# Patient Record
Sex: Female | Born: 1960
Health system: Southern US, Community
[De-identification: ages and names within clinical notes are randomized; demographics above are authoritative.]

---

## 2000-09-30 ENCOUNTER — Emergency Department (HOSPITAL_COMMUNITY): Admission: EM | Admit: 2000-09-30 | Discharge: 2000-10-01 | Payer: Self-pay | Admitting: *Deleted

## 2000-09-30 ENCOUNTER — Emergency Department (HOSPITAL_COMMUNITY): Admission: RE | Admit: 2000-09-30 | Discharge: 2000-09-30 | Payer: Self-pay | Admitting: *Deleted

## 2000-09-30 ENCOUNTER — Encounter: Payer: Self-pay | Admitting: *Deleted

## 2002-11-08 ENCOUNTER — Encounter: Payer: Self-pay | Admitting: Obstetrics and Gynecology

## 2002-11-08 ENCOUNTER — Encounter: Admission: RE | Admit: 2002-11-08 | Discharge: 2002-11-08 | Payer: Self-pay | Admitting: Obstetrics and Gynecology

## 2004-02-06 ENCOUNTER — Other Ambulatory Visit: Admission: RE | Admit: 2004-02-06 | Discharge: 2004-02-06 | Payer: Self-pay | Admitting: Family Medicine

## 2004-03-01 ENCOUNTER — Encounter: Admission: RE | Admit: 2004-03-01 | Discharge: 2004-03-01 | Payer: Self-pay | Admitting: Family Medicine

## 2005-03-25 ENCOUNTER — Emergency Department (HOSPITAL_COMMUNITY): Admission: EM | Admit: 2005-03-25 | Discharge: 2005-03-25 | Payer: Self-pay | Admitting: Emergency Medicine

## 2005-04-05 ENCOUNTER — Encounter: Admission: RE | Admit: 2005-04-05 | Discharge: 2005-04-05 | Payer: Self-pay | Admitting: Family Medicine

## 2005-04-19 ENCOUNTER — Other Ambulatory Visit: Admission: RE | Admit: 2005-04-19 | Discharge: 2005-04-19 | Payer: Self-pay | Admitting: Gynecology

## 2006-04-03 ENCOUNTER — Other Ambulatory Visit: Admission: RE | Admit: 2006-04-03 | Discharge: 2006-04-03 | Payer: Self-pay | Admitting: Gynecology

## 2006-04-26 ENCOUNTER — Encounter: Admission: RE | Admit: 2006-04-26 | Discharge: 2006-04-26 | Payer: Self-pay | Admitting: Gynecology

## 2007-04-05 ENCOUNTER — Other Ambulatory Visit: Admission: RE | Admit: 2007-04-05 | Discharge: 2007-04-05 | Payer: Self-pay | Admitting: Gynecology

## 2007-04-30 ENCOUNTER — Encounter: Admission: RE | Admit: 2007-04-30 | Discharge: 2007-04-30 | Payer: Self-pay | Admitting: Gynecology

## 2008-04-29 ENCOUNTER — Encounter: Admission: RE | Admit: 2008-04-29 | Discharge: 2008-04-29 | Payer: Self-pay | Admitting: Gynecology

## 2009-05-01 ENCOUNTER — Encounter: Admission: RE | Admit: 2009-05-01 | Discharge: 2009-05-01 | Payer: Self-pay | Admitting: Gynecology

## 2010-05-05 ENCOUNTER — Encounter
Admission: RE | Admit: 2010-05-05 | Discharge: 2010-05-05 | Payer: Self-pay | Source: Home / Self Care | Attending: Gynecology | Admitting: Gynecology

## 2010-05-09 ENCOUNTER — Encounter: Payer: Self-pay | Admitting: Gynecology

## 2010-09-03 NOTE — Consult Note (Signed)
NAMEPASQUALINA, Kristen Patel            ACCOUNT NO.:  [id number]   MEDICAL RECORD NO.:  [id number]          PATIENT TYPE:  EMS   LOCATION:  ED                           FACILITY:  [facility]   PHYSICIAN:  Sandria Bales. Ezzard Standing, M.D.  DATE OF BIRTH:  June 01, 1960   DATE OF CONSULTATION:  03/25/2005  DATE OF DISCHARGE:                                   CONSULTATION   This is a 50 year old Filipino female who started having abdominal pain on  the evening of March 23, 2005. This pain got worse yesterday, and then  today she went to [facility] and then she was  referred to [facility] emergency room for further evaluation.  She has had no nausea or vomiting though her appetite has not been good. She  has found some improvement with when she has a bowel movement with this  pain. She says she had a fever this morning. She has denied any history of  peptic ulcer disease, liver disease, pancreas disease, colon disease. Her  only prior abdominal surgery was three C-sections.   PAST MEDICAL HISTORY:  She has no allergies.   She is on no medications.   REVIEW OF SYSTEMS:  NEUROLOGIC:  No seizure, loss of consciousness.  PULMONARY:  Does not smoke cigarettes, no history of pneumonia.  CARDIAC:  No heart disease, chest pain, or hypertension.  GASTROINTESTINAL:  See history of present illness.  UROLOGIC:  No history of kidney stones or kidney infections.  GYN:  Her last menstrual period was last week. She has had three C-sections.  Her children are 71, 54, and 58 years of age.   She did say that in November she had the flu which lasted about 4 days, and  this was again in November, but she had no real GI symptoms with the flu.  She works at [employer]. She is accompanied by her  fiance, Endya Austin, who is present during most of the discussion.   PHYSICAL EXAMINATION:  VITAL SIGNS:  Her temperature is 98.9, blood pressure  103/63, her pulse is 78, respirations  18, saturations are 99%.  GENERAL:  She is a well-nourished, pleasant Filipino female.  HEENT:  Unremarkable.  NECK:  Supple without mass, without thyromegaly.  LUNGS:  Clear to auscultation.  HEART:  Has a regular rate and rhythm. She is not tachycardic. She has no  murmur.  ABDOMEN:  Soft. She has present but decreased bowel sounds. She may have  some very minimal discomfort in her lower abdomen but there is certainly no  acute surgical finding. She has no rebound, no guarding.  EXTREMITIES:  She has good strength in all four extremities.  NEUROLOGIC:  Grossly intact.   Her labs show a white blood count of 14,000; a hemoglobin of 13. Her sodium  is 136, her creatinine is 0.5. Her urinalysis was negative for any blood.  Her amylase was 73.   DIAGNOSIS:  1.  Vague lower abdominal pain which is mild. It is very unclear what this      is, and I am not real impressed with the physical  exam from a surgery      standpoint.  She actually has slept in the [facility] most of the night and      feels much better than when she came in.  Her abdominal pain is minimal.   I think she would best be served to rule out any significant disease would  be to do a CT scan of abdomen and pelvis. Discussed this with her and her  fiance and they agreed to this.   Obviously, if it shows something surgical, plan admission; if not, then she  will be discharged home. I explained this to her also.      Sandria Bales. Ezzard Standing, M.D.  Electronically Signed     DHN/MEDQ  D:  03/25/2005  T:  03/25/2005  Job:  [id number]   cc:   Charlesetta Shanks  Fax: [phone]   Magnus Sinning. Rice, M.D.  Fax: [phone]

## 2011-03-30 ENCOUNTER — Other Ambulatory Visit: Payer: Self-pay | Admitting: Gynecology

## 2011-03-30 DIAGNOSIS — Z1231 Encounter for screening mammogram for malignant neoplasm of breast: Secondary | ICD-10-CM

## 2011-05-10 ENCOUNTER — Ambulatory Visit
Admission: RE | Admit: 2011-05-10 | Discharge: 2011-05-10 | Disposition: A | Discharge status: Home / Self Care | Payer: BC Managed Care – PPO | Source: Ambulatory Visit | Attending: Gynecology | Admitting: Gynecology

## 2011-05-10 DIAGNOSIS — Z1231 Encounter for screening mammogram for malignant neoplasm of breast: Secondary | ICD-10-CM

## 2012-03-28 ENCOUNTER — Other Ambulatory Visit: Payer: Self-pay | Admitting: Gynecology

## 2012-03-28 DIAGNOSIS — Z1231 Encounter for screening mammogram for malignant neoplasm of breast: Secondary | ICD-10-CM

## 2012-05-11 ENCOUNTER — Ambulatory Visit
Admission: RE | Admit: 2012-05-11 | Discharge: 2012-05-11 | Disposition: A | Discharge status: Home / Self Care | Payer: BC Managed Care – PPO | Source: Ambulatory Visit | Attending: Gynecology | Admitting: Gynecology

## 2012-05-11 DIAGNOSIS — Z1231 Encounter for screening mammogram for malignant neoplasm of breast: Secondary | ICD-10-CM

## 2012-11-09 ENCOUNTER — Encounter: Payer: Self-pay | Admitting: Family Medicine

## 2012-11-09 ENCOUNTER — Other Ambulatory Visit (HOSPITAL_COMMUNITY): Payer: BC Managed Care – PPO

## 2012-11-09 ENCOUNTER — Ambulatory Visit (INDEPENDENT_AMBULATORY_CARE_PROVIDER_SITE_OTHER): Payer: BC Managed Care – PPO | Admitting: Family Medicine

## 2012-11-09 ENCOUNTER — Inpatient Hospital Stay (HOSPITAL_COMMUNITY): Admission: RE | Admit: 2012-11-09 | Payer: BC Managed Care – PPO | Source: Ambulatory Visit

## 2012-11-09 ENCOUNTER — Ambulatory Visit (HOSPITAL_COMMUNITY)
Admission: RE | Admit: 2012-11-09 | Discharge: 2012-11-09 | Disposition: A | Discharge status: Home / Self Care | Payer: BC Managed Care – PPO | Source: Ambulatory Visit | Attending: Family Medicine | Admitting: Family Medicine

## 2012-11-09 ENCOUNTER — Telehealth: Payer: Self-pay | Admitting: Family Medicine

## 2012-11-09 VITALS — BP 116/73 | HR 62 | Temp 97.3°F | Ht 61.0 in | Wt 131.8 lb

## 2012-11-09 DIAGNOSIS — R1012 Left upper quadrant pain: Secondary | ICD-10-CM

## 2012-11-09 DIAGNOSIS — K5730 Diverticulosis of large intestine without perforation or abscess without bleeding: Secondary | ICD-10-CM | POA: Insufficient documentation

## 2012-11-09 MED ORDER — IOHEXOL 300 MG/ML  SOLN
100.0000 mL | Freq: Once | INTRAMUSCULAR | Status: AC | PRN
  Administered 2012-11-09: 100 mL via INTRAVENOUS

## 2012-11-09 MED ORDER — IOHEXOL 300 MG/ML  SOLN
50.0000 mL | Freq: Once | INTRAMUSCULAR | Status: AC | PRN
  Administered 2012-11-09: 50 mL via ORAL

## 2012-11-09 MED ORDER — PANTOPRAZOLE SODIUM 40 MG PO TBEC: 40.0000 mg | DELAYED_RELEASE_TABLET | Freq: Every day | ORAL | Status: DC

## 2012-11-09 NOTE — Progress Notes (Signed)
New Patient Visit:  HPI:  ABDOMINAL PAIN Location: LUQ Onset: 1 week  Description: has had persistent LUQ abd pain. No radiation to back. Moderate in nature. Has seemed to worsen over last 24 hours.  Modifying factors: none    Symptoms Nausea/Vomiting: no Diarrhea: no Constipation: possible  Melena/BRBPR: no Hematemesis: no Anorexia: no Fever/Chills: no Jaundice: no Dysuria: no Back pain: no Rash: no Weight loss: no Vaginal bleeding: no STD exposure: no LMP: no Alcohol use: no NSAID use: denies  PMH Past Surgeries: no Had normal colonoscopy about 1-2 years ago per pt.       There are no active problems to display for this patient.  Past Medical History: History reviewed. No pertinent past medical history.  Past Surgical History: Past Surgical History  Procedure Laterality Date  . Cesarean section      Social History: History   Social History  . Marital Status: Married    Spouse Name: N/A    Number of Children: N/A  . Years of Education: N/A   Social History Main Topics  . Smoking status: Never Smoker   . Smokeless tobacco: None  . Alcohol Use: No  . Drug Use: No  . Sexually Active: None   Other Topics Concern  . None   Social History Narrative  . None    Family History: History reviewed. No pertinent family history.  Allergies: No Known Allergies  Current Outpatient Prescriptions  Medication Sig Dispense Refill  . calcium citrate (CALCITRATE - DOSED IN MG ELEMENTAL CALCIUM) 950 MG tablet Take 1 tablet by mouth daily.      . Cholecalciferol (VITAMIN D3) 3000 UNITS TABS Take by mouth.      . fish oil-omega-3 fatty acids 1000 MG capsule Take 2 g by mouth daily.      . pantoprazole (PROTONIX) 40 MG tablet Take 1 tablet (40 mg total) by mouth daily.  30 tablet  3   No current facility-administered medications for this visit.   Review Of Systems: 12 point ROS negative except as noted above in HPI  Physical Exam: Filed Vitals:   11/09/12 1123  BP: 116/73  Pulse: 62  Temp: 97.3 F (36.3 C)    Physical Exam  Constitutional: She appears well-developed and well-nourished.  HENT:  Head: Normocephalic and atraumatic.  Eyes: Conjunctivae are normal. Pupils are equal, round, and reactive to light.  Neck: Normal range of motion. Neck supple.  Cardiovascular: Normal rate and regular rhythm.   Pulmonary/Chest: Effort normal and breath sounds normal.  Abdominal: Soft. Bowel sounds are normal.  Marked RUQ TTP    Musculoskeletal: Normal range of motion.  Neurological: She is alert.  Skin: Skin is warm.      Axssessment and Plan:   Abdominal pain, left upper quadrant - Plan: CBC with Differential, Comprehensive metabolic panel, Lipase, pantoprazole (PROTONIX) 40 MG tablet  DDx for sxs remains relatively broad including gastritis, colitis, pancreatitis, constipation.  Pt did have some symptomatic improvement with GI cocktail, however abd pain persists.  Will send pt for CT Abd and Pelvis with IV and oral contrast to better assess anatomy.  Will also check baseline labs including CBC, CMET, lipase.  Start on PPI Follow up pending blood work and imaging.  Discussed GI and infectious red flags.

## 2012-11-09 NOTE — Progress Notes (Deleted)
  Subjective:    Patient ID: Kristen Patel, female    DOB: 1961/01/06, 52 y.o.   MRN: [id number]  HPI ABDOMINAL PAIN Location: LUQ Onset: 1 week  Description: has had persistent LUQ abd pain. No radiation to back. Moderate in nature. Has seemed to worsen over last 24 hours.  Modifying factors: none    Symptoms Nausea/Vomiting: no Diarrhea: no Constipation: possible  Melena/BRBPR: no Hematemesis: no Anorexia: no Fever/Chills: no Jaundice: no Dysuria: no Back pain: no Rash: no Weight loss: no Vaginal bleeding: no STD exposure: no LMP: no Alcohol use: no NSAID use: denies  PMH Past Surgeries: no Had normal colonoscopy about 1-2 years ago per pt.       Review of Systems  All other systems reviewed and are negative.       Objective:   Physical Exam  Constitutional: She appears well-developed and well-nourished.  HENT:  Head: Normocephalic and atraumatic.  Eyes: Conjunctivae are normal. Pupils are equal, round, and reactive to light.  Neck: Normal range of motion. Neck supple.  Cardiovascular: Normal rate and regular rhythm.   Pulmonary/Chest: Effort normal and breath sounds normal.  Abdominal: Soft. Bowel sounds are normal.  Marked RUQ TTP    Musculoskeletal: Normal range of motion.  Neurological: She is alert.  Skin: Skin is warm.          Assessment & Plan:  Abdominal pain, left upper quadrant - Plan: CBC with Differential, Comprehensive metabolic panel, Lipase, pantoprazole (PROTONIX) 40 MG tablet  DDx for sxs remains relatively broad including gastritis, colitis, pancreatitis, constipation.  Pt did have some symptomatic improvement with GI cocktail, however abd pain persists.  Will send pt for CT Abd and Pelvis with IV and oral contrast to better assess anatomy.  Will also check baseline labs including CBC, CMET, lipase.  Start on PPI Follow up pending blood work and imaging.  Discussed GI and infectious red flags.

## 2012-11-21 ENCOUNTER — Telehealth: Payer: Self-pay | Admitting: Family Medicine

## 2012-11-21 ENCOUNTER — Other Ambulatory Visit: Payer: Self-pay | Admitting: *Deleted

## 2012-11-21 DIAGNOSIS — K5790 Diverticulosis of intestine, part unspecified, without perforation or abscess without bleeding: Secondary | ICD-10-CM

## 2012-11-21 DIAGNOSIS — R1012 Left upper quadrant pain: Secondary | ICD-10-CM

## 2013-04-09 ENCOUNTER — Other Ambulatory Visit: Payer: Self-pay

## 2013-04-09 DIAGNOSIS — Z1231 Encounter for screening mammogram for malignant neoplasm of breast: Secondary | ICD-10-CM

## 2013-05-13 ENCOUNTER — Ambulatory Visit
Admission: RE | Admit: 2013-05-13 | Discharge: 2013-05-13 | Disposition: A | Discharge status: Home / Self Care | Payer: BC Managed Care – PPO | Source: Ambulatory Visit

## 2013-05-13 DIAGNOSIS — Z1231 Encounter for screening mammogram for malignant neoplasm of breast: Secondary | ICD-10-CM

## 2014-01-20 ENCOUNTER — Telehealth: Payer: Self-pay | Admitting: Family

## 2014-01-21 ENCOUNTER — Ambulatory Visit (INDEPENDENT_AMBULATORY_CARE_PROVIDER_SITE_OTHER): Payer: BC Managed Care – PPO | Admitting: Family

## 2014-01-21 ENCOUNTER — Encounter: Payer: Self-pay | Admitting: Family

## 2014-01-21 VITALS — BP 122/71 | HR 65 | Temp 97.4°F | Ht 61.0 in | Wt 136.5 lb

## 2014-01-21 DIAGNOSIS — R05 Cough: Secondary | ICD-10-CM

## 2014-01-21 DIAGNOSIS — J069 Acute upper respiratory infection, unspecified: Secondary | ICD-10-CM

## 2014-01-21 DIAGNOSIS — R059 Cough, unspecified: Secondary | ICD-10-CM

## 2014-01-21 MED ORDER — BENZONATATE 200 MG PO CAPS: 200.0000 mg | ORAL_CAPSULE | Freq: Three times a day (TID) | ORAL | Status: DC | PRN

## 2014-01-21 MED ORDER — METHYLPREDNISOLONE (PAK) 4 MG PO TABS: ORAL_TABLET | ORAL | Status: DC

## 2014-01-21 MED ORDER — AZITHROMYCIN 250 MG PO TABS: ORAL_TABLET | ORAL | Status: DC

## 2014-01-21 NOTE — Telephone Encounter (Signed)
Unable to reach patient on home phone. Husband has an appt at 8:30. Scheduled patient at same time with Jannifer Rodneyhristy Hawks, FNP.

## 2014-01-21 NOTE — Patient Instructions (Addendum)
Cough, Adult  A cough is a reflex that helps clear your throat and airways. It can help heal the body or may be a reaction to an irritated airway. A cough may only last 2 or 3 weeks (acute) or may last more than 8 weeks (chronic).  CAUSES Acute cough:  Viral or bacterial infections. Chronic cough:  Infections.  Allergies.  Asthma.  Post-nasal drip.  Smoking.  Heartburn or acid reflux.  Some medicines.  Chronic lung problems (COPD).  Cancer. SYMPTOMS   Cough.  Fever.  Chest pain.  Increased breathing rate.  High-pitched whistling sound when breathing (wheezing).  Colored mucus that you cough up (sputum). TREATMENT   A bacterial cough may be treated with antibiotic medicine.  A viral cough must run its course and will not respond to antibiotics.  Your caregiver may recommend other treatments if you have a chronic cough. HOME CARE INSTRUCTIONS   Only take over-the-counter or prescription medicines for pain, discomfort, or fever as directed by your caregiver. Use cough suppressants only as directed by your caregiver.  Use a cold steam vaporizer or humidifier in your bedroom or home to help loosen secretions.  Sleep in a semi-upright position if your cough is worse at night.  Rest as needed.  Stop smoking if you smoke. SEEK IMMEDIATE MEDICAL CARE IF:   You have pus in your sputum.  Your cough starts to worsen.  You cannot control your cough with suppressants and are losing sleep.  You begin coughing up blood.  You have difficulty breathing.  You develop pain which is getting worse or is uncontrolled with medicine.  You have a fever. MAKE SURE YOU:   Understand these instructions.  Will watch your condition.  Will get help right away if you are not doing well or get worse. Document Released: 10/01/2010 Document Revised: 06/27/2011 Document Reviewed: 10/01/2010 [facility] Patient Information 2015 [place]. This information is not intended  to replace advice given to you by your health care provider. Make sure you discuss any questions you have with your health care provider.  - Take meds as prescribed - Use a cool mist humidifier  -Use saline nose sprays frequently -Saline irrigations of the nose can be very helpful if done frequently.  * 4X daily for 1 week*  * Use of a nettie pot can be helpful with this. Follow directions with this* -Force fluids -For any cough or congestion  Use plain Mucinex- regular strength or max strength is fine   * Children- consult with Pharmacist for dosing -For fever or aces or pains- take tylenol or ibuprofen appropriate for age and weight.  * for fevers greater than 101 orally you may alternate ibuprofen and tylenol every  3 hours. -Throat lozenges if help  Jannifer Rodneyhristy Hawks, FNP

## 2014-01-21 NOTE — Progress Notes (Signed)
Subjective:    Patient ID: Kristen Patel, female    DOB: January 04, 1961, 53 y.o.   MRN: [id number]  Cough This is a new problem. The current episode started 1 to 4 weeks ago. The problem has been waxing and waning. The problem occurs constantly. The cough is productive of purulent sputum. Associated symptoms include headaches, hemoptysis and a sore throat. Pertinent negatives include no chills, ear congestion, ear pain, fever, nasal congestion, postnasal drip, rhinorrhea, shortness of breath or wheezing. The symptoms are aggravated by lying down. She has tried OTC cough suppressant for the symptoms. The treatment provided mild relief. There is no history of asthma or COPD.      Review of Systems  Constitutional: Negative.  Negative for fever and chills.  HENT: Positive for sore throat. Negative for ear pain, postnasal drip and rhinorrhea.   Eyes: Negative.   Respiratory: Positive for cough and hemoptysis. Negative for shortness of breath and wheezing.   Cardiovascular: Negative.  Negative for palpitations.  Gastrointestinal: Negative.   Endocrine: Negative.   Genitourinary: Negative.   Musculoskeletal: Negative.   Neurological: Positive for headaches.  Hematological: Negative.   Psychiatric/Behavioral: Negative.   All other systems reviewed and are negative.      Objective:   Physical Exam  Vitals reviewed. Constitutional: She is oriented to person, place, and time. She appears well-developed and well-nourished. No distress.  HENT:  Head: Normocephalic and atraumatic.  Right Ear: External ear normal.  Left Ear: External ear normal.  Nasal passage mild erythemas Oropharyngeal erythemas  Eyes: Pupils are equal, round, and reactive to light.  Neck: Normal range of motion. Neck supple. No thyromegaly present.  Cardiovascular: Normal rate, regular rhythm, normal heart sounds and intact distal pulses.   No murmur heard. Pulmonary/Chest: Effort normal and breath sounds normal. No  respiratory distress. She has no wheezes.  Abdominal: Soft. Bowel sounds are normal. She exhibits no distension. There is no tenderness.  Musculoskeletal: Normal range of motion. She exhibits no edema and no tenderness.  Neurological: She is alert and oriented to person, place, and time. She has normal reflexes. No cranial nerve deficit.  Skin: Skin is warm and dry.  Psychiatric: She has a normal mood and affect. Her behavior is normal. Judgment and thought content normal.    BP 122/71  Pulse 65  Temp(Src) 97.4 F (36.3 C) (Oral)  Ht 5\' 1"  (1.549 m)  Wt 136 lb 8 oz (61.916 kg)  BMI 25.80 kg/m2       Assessment & Plan:  1. URI (upper respiratory infection) -- Take meds as prescribed - Use a cool mist humidifier  -Use saline nose sprays frequently -Saline irrigations of the nose can be very helpful if done frequently.  * 4X daily for 1 week*  * Use of a nettie pot can be helpful with this. Follow directions with this* -Force fluids -For any cough or congestion  Use plain Mucinex- regular strength or max strength is fine   * Children- consult with Pharmacist for dosing -For fever or aces or pains- take tylenol or ibuprofen appropriate for age and weight.  * for fevers greater than 101 orally you may alternate ibuprofen and tylenol every  3 hours. -Throat lozenges if help - azithromycin (ZITHROMAX) 250 MG tablet; Take 500 mg once, then 250 mg for 4 days  Dispense: 6 tablet; Refill: 0 - benzonatate (TESSALON) 200 MG capsule; Take 1 capsule (200 mg total) by mouth 3 (three) times daily as needed.  Dispense: 30  capsule; Refill: 1 - methylPREDNIsolone (MEDROL DOSPACK) 4 MG tablet; follow package directions  Dispense: 21 tablet; Refill: 0  2. Cough - azithromycin (ZITHROMAX) 250 MG tablet; Take 500 mg once, then 250 mg for 4 days  Dispense: 6 tablet; Refill: 0 - benzonatate (TESSALON) 200 MG capsule; Take 1 capsule (200 mg total) by mouth 3 (three) times daily as needed.  Dispense: 30  capsule; Refill: 1 - methylPREDNIsolone (MEDROL DOSPACK) 4 MG tablet; follow package directions  Dispense: 21 tablet; Refill: 0  Jannifer Rodneyhristy Shaine Newmark, FNP

## 2014-03-04 ENCOUNTER — Other Ambulatory Visit: Payer: Self-pay

## 2014-03-04 DIAGNOSIS — Z1231 Encounter for screening mammogram for malignant neoplasm of breast: Secondary | ICD-10-CM

## 2014-05-14 ENCOUNTER — Ambulatory Visit
Admission: RE | Admit: 2014-05-14 | Discharge: 2014-05-14 | Disposition: A | Discharge status: Home / Self Care | Payer: BLUE CROSS/BLUE SHIELD | Source: Ambulatory Visit

## 2014-05-14 DIAGNOSIS — Z1231 Encounter for screening mammogram for malignant neoplasm of breast: Secondary | ICD-10-CM

## 2015-04-06 ENCOUNTER — Other Ambulatory Visit: Payer: Self-pay

## 2015-04-06 DIAGNOSIS — Z1231 Encounter for screening mammogram for malignant neoplasm of breast: Secondary | ICD-10-CM

## 2015-05-21 ENCOUNTER — Ambulatory Visit: Payer: BLUE CROSS/BLUE SHIELD

## 2015-06-04 ENCOUNTER — Ambulatory Visit
Admission: RE | Admit: 2015-06-04 | Discharge: 2015-06-04 | Disposition: A | Discharge status: Home / Self Care | Payer: BLUE CROSS/BLUE SHIELD | Source: Ambulatory Visit

## 2015-06-04 DIAGNOSIS — Z1231 Encounter for screening mammogram for malignant neoplasm of breast: Secondary | ICD-10-CM

## 2016-03-07 ENCOUNTER — Other Ambulatory Visit: Payer: Self-pay | Admitting: Family

## 2016-03-07 DIAGNOSIS — Z1231 Encounter for screening mammogram for malignant neoplasm of breast: Secondary | ICD-10-CM

## 2016-06-09 ENCOUNTER — Ambulatory Visit
Admission: RE | Admit: 2016-06-09 | Discharge: 2016-06-09 | Disposition: A | Discharge status: Home / Self Care | Payer: BLUE CROSS/BLUE SHIELD | Source: Ambulatory Visit | Attending: Family | Admitting: Family

## 2016-06-09 DIAGNOSIS — Z1231 Encounter for screening mammogram for malignant neoplasm of breast: Secondary | ICD-10-CM | POA: Diagnosis not present

## 2016-06-16 ENCOUNTER — Encounter: Payer: Self-pay | Admitting: Nurse Practitioner

## 2016-06-16 ENCOUNTER — Ambulatory Visit (INDEPENDENT_AMBULATORY_CARE_PROVIDER_SITE_OTHER): Payer: BLUE CROSS/BLUE SHIELD | Admitting: Nurse Practitioner

## 2016-06-16 ENCOUNTER — Ambulatory Visit: Payer: Self-pay | Admitting: Nurse Practitioner

## 2016-06-16 VITALS — BP 107/58 | HR 83 | Temp 97.4°F | Ht 61.0 in | Wt 136.8 lb

## 2016-06-16 DIAGNOSIS — J209 Acute bronchitis, unspecified: Secondary | ICD-10-CM | POA: Diagnosis not present

## 2016-06-16 MED ORDER — HYDROCODONE-HOMATROPINE 5-1.5 MG/5ML PO SYRP: 5.0000 mL | ORAL_SOLUTION | Freq: Four times a day (QID) | ORAL | 0 refills | Status: DC | PRN

## 2016-06-16 MED ORDER — AZITHROMYCIN 250 MG PO TABS: ORAL_TABLET | ORAL | 0 refills | Status: DC

## 2016-06-16 NOTE — Progress Notes (Signed)
Subjective:     Kristen SquibbLeticia G Patel is a 56 y.o. female here for evaluation of a cough. Onset of symptoms was 6 days ago. Symptoms have been gradually worsening since that time. The cough is harsh and productive of white and tenacious sputum and is aggravated by nothing. Associated symptoms include: change in voice, sputum production and sore throat. Patient does not have a history of asthma. Patient does not have a history of environmental allergens. Patient has not traveled recently. Patient does not have a history of smoking. Patient has not had a previous chest x-ray. Patient has not had a PPD done.  The following portions of the patient's history were reviewed and updated as appropriate: allergies, current medications, past family history, past medical history, past social history, past surgical history and problem list.  Review of Systems Pertinent items noted in HPI and remainder of comprehensive ROS otherwise negative.    Objective:     BP (!) 107/58   Pulse 83   Temp 97.4 F (36.3 C) (Oral)   Ht 5\' 1"  (1.549 m)   Wt 136 lb 12.8 oz (62.1 kg)   BMI 25.85 kg/m  General appearance: alert, cooperative and mild distress Eyes: conjunctivae/corneas clear. PERRL, EOM's intact. Fundi benign. Ears: normal TM's and external ear canals both ears Nose: clear discharge, moderate congestion, turbinates red, no sinus tenderness Throat: lips, mucosa, and tongue normal; teeth and gums normal Neck: no adenopathy, no carotid bruit, no JVD, supple, symmetrical, trachea midline and thyroid not enlarged, symmetric, no tenderness/mass/nodules Lungs: clear to auscultation bilaterally and dry dep cough Heart: regular rate and rhythm, S1, S2 normal, no murmur, click, rub or gallop    Assessment:    Acute Bronchitis    Plan:      1. Take meds as prescribed 2. Use a cool mist humidifier especially during the winter months and when heat has been humid. 3. Use saline nose sprays frequently 4. Saline  irrigations of the nose can be very helpful if done frequently.  * 4X daily for 1 week*  * Use of a nettie pot can be helpful with this. Follow directions with this* 5. Drink plenty of fluids 6. Keep thermostat turn down low 7.For any cough or congestion  Use plain Mucinex- regular strength or max strength is fine   * Children- consult with Pharmacist for dosing 8. For fever or aces or pains- take tylenol or ibuprofen appropriate for age and weight.  * for fevers greater than 101 orally you may alternate ibuprofen and tylenol every  3 hours.   Meds ordered this encounter  Medications  . azithromycin (ZITHROMAX Z-PAK) 250 MG tablet    Sig: As directed    Dispense:  6 tablet    Refill:  0    Order Specific Question:   Supervising Provider    Answer:   VINCENT, Kristen Patel [[id number]]  . HYDROcodone-homatropine (HYCODAN) 5-1.5 MG/5ML syrup    Sig: Take 5 mLs by mouth every 6 (six) hours as needed for cough.    Dispense:  120 mL    Refill:  0    Order Specific Question:   Supervising Provider    Answer:   Kristen SheriffVINCENT, Kristen Patel [[id number]]   Kristen Daphine DeutscherMartin, FNP

## 2016-06-16 NOTE — Patient Instructions (Signed)

## 2016-06-22 ENCOUNTER — Telehealth: Payer: Self-pay | Admitting: Nurse Practitioner

## 2016-06-22 NOTE — Telephone Encounter (Signed)
Patient notified that note up front and ready for pick up

## 2016-06-29 ENCOUNTER — Telehealth: Payer: Self-pay | Admitting: Family

## 2017-04-26 ENCOUNTER — Other Ambulatory Visit: Payer: Self-pay | Admitting: Family

## 2017-04-26 DIAGNOSIS — Z1231 Encounter for screening mammogram for malignant neoplasm of breast: Secondary | ICD-10-CM

## 2017-06-15 ENCOUNTER — Ambulatory Visit: Payer: BLUE CROSS/BLUE SHIELD

## 2017-08-11 ENCOUNTER — Encounter: Payer: Self-pay | Admitting: Family Medicine

## 2017-08-11 ENCOUNTER — Ambulatory Visit (INDEPENDENT_AMBULATORY_CARE_PROVIDER_SITE_OTHER): Payer: BLUE CROSS/BLUE SHIELD | Admitting: Family Medicine

## 2017-08-11 VITALS — BP 117/61 | HR 70 | Temp 97.5°F | Ht 61.0 in | Wt 139.0 lb

## 2017-08-11 DIAGNOSIS — R059 Cough, unspecified: Secondary | ICD-10-CM

## 2017-08-11 DIAGNOSIS — R05 Cough: Secondary | ICD-10-CM

## 2017-08-11 DIAGNOSIS — J019 Acute sinusitis, unspecified: Secondary | ICD-10-CM

## 2017-08-11 MED ORDER — GUAIFENESIN-CODEINE 100-10 MG/5ML PO SOLN: 5.0000 mL | ORAL | 0 refills | Status: DC | PRN

## 2017-08-11 MED ORDER — AZITHROMYCIN 250 MG PO TABS: ORAL_TABLET | ORAL | 0 refills | Status: DC

## 2017-08-11 NOTE — Progress Notes (Signed)
Subjective: CC: allergies PCP: Junie SpencerHawks, Christy A, FNP RUE:[id number]:Kristen Patel is a 57 y.o. female presenting to clinic today for:  1. Allergies Patient reports that she has had a dry cough, chest congestion, sneezing, rhinorrhea and sensation of difficulty taking a deep breath in for about 9 days now.  She notes that over the last 2 days she has been using over-the-counter NyQuil and DayQuil as well as Allegra.  She has found this somewhat helpful and notices improvement in sleep, particularly with the NyQuil.  She notes that she typically gets sick around this time every year and requires an antibiotic and cough medication in order to get rid of symptoms.  Denies any hemoptysis, change in exercise tolerance, lower extremity edema, fevers, nausea, vomiting or diarrhea.   ROS: Per HPI  No Known Allergies No past medical history on file.  Current Outpatient Medications:  .  azithromycin (ZITHROMAX Z-PAK) 250 MG tablet, As directed, Disp: 6 tablet, Rfl: 0 .  calcium citrate (CALCITRATE - DOSED IN MG ELEMENTAL CALCIUM) 950 MG tablet, Take 1 tablet by mouth daily., Disp: , Rfl:  .  Cholecalciferol (VITAMIN D3) 3000 UNITS TABS, Take by mouth., Disp: , Rfl:  .  fish oil-omega-3 fatty acids 1000 MG capsule, Take 2 g by mouth daily., Disp: , Rfl:  .  HYDROcodone-homatropine (HYCODAN) 5-1.5 MG/5ML syrup, Take 5 mLs by mouth every 6 (six) hours as needed for cough., Disp: 120 mL, Rfl: 0 Social History   Socioeconomic History  . Marital status: Married    Spouse name: Not on file  . Number of children: Not on file  . Years of education: Not on file  . Highest education level: Not on file  Occupational History  . Not on file  Social Needs  . Financial resource strain: Not on file  . Food insecurity:    Worry: Not on file    Inability: Not on file  . Transportation needs:    Medical: Not on file    Non-medical: Not on file  Tobacco Use  . Smoking status: Never Smoker  . Smokeless tobacco:  Never Used  Substance and Sexual Activity  . Alcohol use: No  . Drug use: No  . Sexual activity: Not on file  Lifestyle  . Physical activity:    Days per week: Not on file    Minutes per session: Not on file  . Stress: Not on file  Relationships  . Social connections:    Talks on phone: Not on file    Gets together: Not on file    Attends religious service: Not on file    Active member of club or organization: Not on file    Attends meetings of clubs or organizations: Not on file    Relationship status: Not on file  . Intimate partner violence:    Fear of current or ex partner: Not on file    Emotionally abused: Not on file    Physically abused: Not on file    Forced sexual activity: Not on file  Other Topics Concern  . Not on file  Social History Narrative  . Not on file   No family history on file.  Objective: Office vital signs reviewed. BP 117/61   Pulse 70   Temp (!) 97.5 F (36.4 C) (Oral)   Ht 5\' 1"  (1.549 m)   Wt 139 lb (63 kg)   SpO2 96%   BMI 26.26 kg/m   Physical Examination:  General: Awake, alert, well  nourished, nontoxic appearing, No acute distress HEENT: Normal    Neck: No masses palpated. No lymphadenopathy    Ears: Tympanic membranes intact, normal light reflex, no erythema, no bulging    Eyes: PERRLA, extraocular membranes intact, sclera white    Nose: nasal turbinates moist, clear nasal discharge    Throat: moist mucus membranes, no erythema, no tonsillar exudate.  Airway is patent Cardio: regular rate and rhythm, S1S2 heard, no murmurs appreciated Pulm: clear to auscultation bilaterally, no wheezes, rhonchi or rales; normal work of breathing on room air  Assessment/ Plan: 57 y.o. female   1. Acute non-recurrent sinusitis, unspecified location Patient is afebrile, nontoxic-appearing and has normal oxygen saturations on room air.  Her symptoms are consistent with a sinusitis, I do suspect that this is more likely to be allergy mediated than  viral mediated.  However, since her symptoms are fairly refractory to OTC medications, I have provided her a short prescription of Robitussin-AC to use as needed as directed.  I cautioned sedation.  Can hold NyQuil and DayQuil while taking this medication.  Avoid use at work.  I have also given her a pocket prescription for Z-Pak.  However, I did reinforce that she does not appear infectious today and that she should hold off on filling this medication unless symptoms substantially worsen or do not respond to current therapies.  She voiced good understanding and will perform home care instructions as directed.  Follow-up as needed.  2. Cough As Above   Meds ordered this encounter  Medications  . guaiFENesin-codeine 100-10 MG/5ML syrup    Sig: Take 5 mLs by mouth every 4 (four) hours as needed for cough.    Dispense:  120 mL    Refill:  0  . azithromycin (ZITHROMAX Z-PAK) 250 MG tablet    Sig: Take 500 mg day 1.  Then take 250 mg days 2 through 5.    Dispense:  6 tablet    Refill:  0    The Narcotic Database has been reviewed.  There were no red flags.    Raliegh Ip, DO [facility] [phone]

## 2017-08-11 NOTE — Patient Instructions (Signed)
I think that this is probably viral.  However, we are close to when we would expect a virus to be over.  For this reason, I have given you a prescription to hold onto until next week if symptoms are not getting better.  For now, use the cough syrup and the Allegra to see if your symptoms get better.  It appears that you have a viral upper respiratory infection (cold).  Cold symptoms can last up to 2 weeks.    - Get plenty of rest and drink plenty of fluids. - Try to breathe moist air. Use a cold mist humidifier. - Consume warm fluids (soup or tea) to provide relief for a stuffy nose and to loosen phlegm. - For nasal stuffiness, try saline nasal spray or a Neti Pot. Afrin nasal spray can also be used but this product should not be used longer than 3 days or it will cause rebound nasal stuffiness (worsening nasal congestion). - For sore throat pain relief: suck on throat lozenges, hard candy or popsicles; gargle with warm salt water (1/4 tsp. salt per 8 oz. of water); and eat soft, bland foods. - Eat a well-balanced diet. If you cannot, ensure you are getting enough nutrients by taking a daily multivitamin. - Avoid dairy products, as they can thicken phlegm. - Avoid alcohol, as it impairs your body's immune system.  CONTACT YOUR DOCTOR IF YOU EXPERIENCE ANY OF THE FOLLOWING: - High fever - Ear pain - Sinus-type headache - Unusually severe cold symptoms - Cough that gets worse while other cold symptoms improve - Flare up of any chronic lung problem, such as asthma - Your symptoms persist longer than 2 weeks

## 2017-09-29 ENCOUNTER — Ambulatory Visit
Admission: RE | Admit: 2017-09-29 | Discharge: 2017-09-29 | Disposition: A | Discharge status: Home / Self Care | Payer: BLUE CROSS/BLUE SHIELD | Source: Ambulatory Visit | Attending: Family | Admitting: Family

## 2017-09-29 DIAGNOSIS — Z1231 Encounter for screening mammogram for malignant neoplasm of breast: Secondary | ICD-10-CM

## 2017-10-17 DIAGNOSIS — Z01419 Encounter for gynecological examination (general) (routine) without abnormal findings: Secondary | ICD-10-CM | POA: Diagnosis not present

## 2017-10-17 DIAGNOSIS — Z6824 Body mass index (BMI) 24.0-24.9, adult: Secondary | ICD-10-CM | POA: Diagnosis not present

## 2018-08-20 ENCOUNTER — Other Ambulatory Visit: Payer: Self-pay | Admitting: Family

## 2018-08-20 DIAGNOSIS — Z1231 Encounter for screening mammogram for malignant neoplasm of breast: Secondary | ICD-10-CM

## 2018-08-31 ENCOUNTER — Telehealth: Payer: Self-pay | Admitting: Family

## 2018-10-25 ENCOUNTER — Other Ambulatory Visit: Payer: Self-pay

## 2018-10-25 ENCOUNTER — Ambulatory Visit
Admission: RE | Admit: 2018-10-25 | Discharge: 2018-10-25 | Disposition: A | Discharge status: Home / Self Care | Payer: BC Managed Care – PPO | Source: Ambulatory Visit | Attending: Family | Admitting: Family

## 2018-10-25 DIAGNOSIS — Z1231 Encounter for screening mammogram for malignant neoplasm of breast: Secondary | ICD-10-CM | POA: Diagnosis not present

## 2019-02-26 ENCOUNTER — Telehealth: Payer: Self-pay | Admitting: Family

## 2019-02-27 ENCOUNTER — Ambulatory Visit: Payer: Self-pay

## 2019-04-11 ENCOUNTER — Other Ambulatory Visit: Payer: BC Managed Care – PPO

## 2019-09-17 ENCOUNTER — Ambulatory Visit (INDEPENDENT_AMBULATORY_CARE_PROVIDER_SITE_OTHER): Payer: Self-pay | Admitting: Family

## 2019-09-17 ENCOUNTER — Encounter: Payer: Self-pay | Admitting: Family

## 2019-09-17 DIAGNOSIS — J209 Acute bronchitis, unspecified: Secondary | ICD-10-CM

## 2019-09-17 MED ORDER — PREDNISONE 10 MG (21) PO TBPK: ORAL_TABLET | ORAL | 0 refills | Status: DC

## 2019-09-17 NOTE — Progress Notes (Signed)
   Virtual Visit via telephone Note Due to COVID-19 pandemic this visit was conducted virtually. This visit type was conducted due to national recommendations for restrictions regarding the COVID-19 Pandemic (e.g. social distancing, sheltering in place) in an effort to limit this patient's exposure and mitigate transmission in our community. All issues noted in this document were discussed and addressed.  A physical exam was not performed with this format.  I connected with Kristen Patel on 09/17/19 at 2:05 pm  by telephone and verified that I am speaking with the correct person using two identifiers. Kristen Patel is currently located at home and no one is currently with her during visit. The provider, Jannifer Rodney, FNP is located in their office at time of visit.  I discussed the limitations, risks, security and privacy concerns of performing an evaluation and management service by telephone and the availability of in person appointments. I also discussed with the patient that there may be a patient responsible charge related to this service. The patient expressed understanding and agreed to proceed.   History and Present Illness:  Cough This is a new problem. The current episode started in the past 7 days. The problem has been waxing and waning. The problem occurs every few minutes. The cough is productive of sputum. Associated symptoms include postnasal drip, shortness of breath ("some times") and wheezing. Pertinent negatives include no chills, ear congestion, ear pain, fever, headaches or myalgias. The symptoms are aggravated by pollens. She has tried OTC cough suppressant for the symptoms. The treatment provided mild relief.      Review of Systems  Constitutional: Negative for chills and fever.  HENT: Positive for postnasal drip. Negative for ear pain.   Respiratory: Positive for cough, shortness of breath ("some times") and wheezing.   Musculoskeletal: Negative for myalgias.    Neurological: Negative for headaches.     Observations/Objective: No SOB or distress noted  Assessment and Plan: 1. Acute bronchitis, unspecified organism - Take meds as prescribed - Use a cool mist humidifier  -Use saline nose sprays frequently -Force fluids -For any cough or congestion  Use plain Mucinex- regular strength or max strength is fine -For fever or aces or pains- take tylenol or ibuprofen. -Throat lozenges if help -Call if symptoms worsen or do not improve  - predniSONE (STERAPRED UNI-PAK 21 TAB) 10 MG (21) TBPK tablet; Use as directed  Dispense: 21 tablet; Refill: 0     I discussed the assessment and treatment plan with the patient. The patient was provided an opportunity to ask questions and all were answered. The patient agreed with the plan and demonstrated an understanding of the instructions.   The patient was advised to call back or seek an in-person evaluation if the symptoms worsen or if the condition fails to improve as anticipated.  The above assessment and management plan was discussed with the patient. The patient verbalized understanding of and has agreed to the management plan. Patient is aware to call the clinic if symptoms persist or worsen. Patient is aware when to return to the clinic for a follow-up visit. Patient educated on when it is appropriate to go to the emergency department.   Time call ended:  2:15 pm  I provided 10 minutes of non-face-to-face time during this encounter.    Jannifer Rodney, FNP

## 2019-09-23 ENCOUNTER — Other Ambulatory Visit: Payer: Self-pay | Admitting: Family

## 2019-09-23 DIAGNOSIS — J209 Acute bronchitis, unspecified: Secondary | ICD-10-CM

## 2020-09-08 ENCOUNTER — Other Ambulatory Visit: Payer: Self-pay | Admitting: Family

## 2020-09-08 DIAGNOSIS — Z1231 Encounter for screening mammogram for malignant neoplasm of breast: Secondary | ICD-10-CM

## 2020-10-27 ENCOUNTER — Other Ambulatory Visit: Payer: Self-pay

## 2020-10-27 ENCOUNTER — Ambulatory Visit
Admission: RE | Admit: 2020-10-27 | Discharge: 2020-10-27 | Disposition: A | Discharge status: Home / Self Care | Payer: BC Managed Care – PPO | Source: Ambulatory Visit | Attending: Family | Admitting: Family

## 2020-10-27 DIAGNOSIS — Z1231 Encounter for screening mammogram for malignant neoplasm of breast: Secondary | ICD-10-CM

## 2020-11-05 ENCOUNTER — Ambulatory Visit: Payer: BC Managed Care – PPO

## 2021-05-20 ENCOUNTER — Ambulatory Visit: Payer: BC Managed Care – PPO | Admitting: Family

## 2021-05-25 ENCOUNTER — Ambulatory Visit: Payer: BC Managed Care – PPO | Admitting: Family

## 2021-05-25 ENCOUNTER — Encounter: Payer: Self-pay | Admitting: Family

## 2021-05-25 VITALS — BP 137/77 | HR 97 | Temp 97.1°F | Ht 61.0 in | Wt 143.8 lb

## 2021-05-25 DIAGNOSIS — E663 Overweight: Secondary | ICD-10-CM

## 2021-05-25 DIAGNOSIS — K219 Gastro-esophageal reflux disease without esophagitis: Secondary | ICD-10-CM | POA: Diagnosis not present

## 2021-05-25 DIAGNOSIS — Z1159 Encounter for screening for other viral diseases: Secondary | ICD-10-CM

## 2021-05-25 DIAGNOSIS — Z1211 Encounter for screening for malignant neoplasm of colon: Secondary | ICD-10-CM | POA: Diagnosis not present

## 2021-05-25 DIAGNOSIS — Z114 Encounter for screening for human immunodeficiency virus [HIV]: Secondary | ICD-10-CM

## 2021-05-25 DIAGNOSIS — Z0000 Encounter for general adult medical examination without abnormal findings: Secondary | ICD-10-CM

## 2021-05-25 DIAGNOSIS — Z0001 Encounter for general adult medical examination with abnormal findings: Secondary | ICD-10-CM | POA: Diagnosis not present

## 2021-05-25 MED ORDER — OMEPRAZOLE 20 MG PO CPDR: 20.0000 mg | DELAYED_RELEASE_CAPSULE | Freq: Every day | ORAL | 1 refills | Status: DC

## 2021-05-25 NOTE — Patient Instructions (Signed)
Gastroesophageal Reflux Disease, Adult ?Gastroesophageal reflux (GER) happens when acid from the stomach flows up into the tube that connects the mouth and the stomach (esophagus). Normally, food travels down the esophagus and stays in the stomach to be digested. However, when a person has GER, food and stomach acid sometimes move back up into the esophagus. If this becomes a more serious problem, the person may be diagnosed with a disease called gastroesophageal reflux disease (GERD). GERD occurs when the reflux: ?Happens often. ?Causes frequent or severe symptoms. ?Causes problems such as damage to the esophagus. ?When stomach acid comes in contact with the esophagus, the acid may cause inflammation in the esophagus. Over time, GERD may create small holes (ulcers) in the lining of the esophagus. ?What are the causes? ?This condition is caused by a problem with the muscle between the esophagus and the stomach (lower esophageal sphincter, or LES). Normally, the LES muscle closes after food passes through the esophagus to the stomach. When the LES is weakened or abnormal, it does not close properly, and that allows food and stomach acid to go back up into the esophagus. ?The LES can be weakened by certain dietary substances, medicines, and medical conditions, including: ?Tobacco use. ?Pregnancy. ?Having a hiatal hernia. ?Alcohol use. ?Certain foods and beverages, such as coffee, chocolate, onions, and peppermint. ?What increases the risk? ?You are more likely to develop this condition if you: ?Have an increased body weight. ?Have a connective tissue disorder. ?Take NSAIDs, such as ibuprofen. ?What are the signs or symptoms? ?Symptoms of this condition include: ?Heartburn. ?Difficult or painful swallowing and the feeling of having a lump in the throat. ?A bitter taste in the mouth. ?Bad breath and having a large amount of saliva. ?Having an upset or bloated stomach and belching. ?Chest pain. Different conditions can  cause chest pain. Make sure you see your health care provider if you experience chest pain. ?Shortness of breath or wheezing. ?Ongoing (chronic) cough or a nighttime cough. ?Wearing away of tooth enamel. ?Weight loss. ?How is this diagnosed? ?This condition may be diagnosed based on a medical history and a physical exam. To determine if you have mild or severe GERD, your health care provider may also monitor how you respond to treatment. You may also have tests, including: ?A test to examine your stomach and esophagus with a small camera (endoscopy). ?A test that measures the acidity level in your esophagus. ?A test that measures how much pressure is on your esophagus. ?A barium swallow or modified barium swallow test to show the shape, size, and functioning of your esophagus. ?How is this treated? ?Treatment for this condition may vary depending on how severe your symptoms are. Your health care provider may recommend: ?Changes to your diet. ?Medicine. ?Surgery. ?The goal of treatment is to help relieve your symptoms and to prevent complications. ?Follow these instructions at home: ?Eating and drinking ? ?Follow a diet as recommended by your health care provider. This may involve avoiding foods and drinks such as: ?Coffee and tea, with or without caffeine. ?Drinks that contain alcohol. ?Energy drinks and sports drinks. ?Carbonated drinks or sodas. ?Chocolate and cocoa. ?Peppermint and mint flavorings. ?Garlic and onions. ?Horseradish. ?Spicy and acidic foods, including peppers, chili powder, curry powder, vinegar, hot sauces, and barbecue sauce. ?Citrus fruit juices and citrus fruits, such as oranges, lemons, and limes. ?Tomato-based foods, such as red sauce, chili, salsa, and pizza with red sauce. ?Fried and fatty foods, such as donuts, french fries, potato chips, and high-fat dressings. ?  High-fat meats, such as hot dogs and fatty cuts of red and white meats, such as rib eye steak, sausage, ham, and  bacon. ?High-fat dairy items, such as whole milk, butter, and cream cheese. ?Eat small, frequent meals instead of large meals. ?Avoid drinking large amounts of liquid with your meals. ?Avoid eating meals during the 2-3 hours before bedtime. ?Avoid lying down right after you eat. ?Do not exercise right after you eat. ?Lifestyle ? ?Do not use any products that contain nicotine or tobacco. These products include cigarettes, chewing tobacco, and vaping devices, such as e-cigarettes. If you need help quitting, ask your health care provider. ?Try to reduce your stress by using methods such as yoga or meditation. If you need help reducing stress, ask your health care provider. ?If you are overweight, reduce your weight to an amount that is healthy for you. Ask your health care provider for guidance about a safe weight loss goal. ?General instructions ?Pay attention to any changes in your symptoms. ?Take over-the-counter and prescription medicines only as told by your health care provider. Do not take aspirin, ibuprofen, or other NSAIDs unless your health care provider told you to take these medicines. ?Wear loose-fitting clothing. Do not wear anything tight around your waist that causes pressure on your abdomen. ?Raise (elevate) the head of your bed about 6 inches (15 cm). You can use a wedge to do this. ?Avoid bending over if this makes your symptoms worse. ?Keep all follow-up visits. This is important. ?Contact a health care provider if: ?You have: ?New symptoms. ?Unexplained weight loss. ?Difficulty swallowing or it hurts to swallow. ?Wheezing or a persistent cough. ?A hoarse voice. ?Your symptoms do not improve with treatment. ?Get help right away if: ?You have sudden pain in your arms, neck, jaw, teeth, or back. ?You suddenly feel sweaty, dizzy, or light-headed. ?You have chest pain or shortness of breath. ?You vomit and the vomit is green, yellow, or black, or it looks like blood or coffee grounds. ?You faint. ?You  have stool that is red, bloody, or black. ?You cannot swallow, drink, or eat. ?These symptoms may represent a serious problem that is an emergency. Do not wait to see if the symptoms will go away. Get medical help right away. Call your local emergency services (911 in the [place] Do not drive yourself to the hospital. ?Summary ?Gastroesophageal reflux happens when acid from the stomach flows up into the esophagus. GERD is a disease in which the reflux happens often, causes frequent or severe symptoms, or causes problems such as damage to the esophagus. ?Treatment for this condition may vary depending on how severe your symptoms are. Your health care provider may recommend diet and lifestyle changes, medicine, or surgery. ?Contact a health care provider if you have new or worsening symptoms. ?Take over-the-counter and prescription medicines only as told by your health care provider. Do not take aspirin, ibuprofen, or other NSAIDs unless your health care provider told you to do so. ?Keep all follow-up visits as told by your health care provider. This is important. ?This information is not intended to replace advice given to you by your health care provider. Make sure you discuss any questions you have with your health care provider. ?Document Revised: 10/14/2019 Document Reviewed: 10/14/2019 ?Elsevier Patient Education ? 2022 [employer] ? ?

## 2021-05-25 NOTE — Progress Notes (Signed)
Subjective:    Patient ID: Kristen Patel, female    DOB: 23-Dec-1960, 61 y.o.   MRN: [id number]  Chief Complaint  Patient presents with   Medical Management of Chronic Issues    Acid reflux   PT presents to the office today for CPE without pap. She is complaining of GERD.  Gastroesophageal Reflux She complains of belching, early satiety and heartburn. She reports no dysphagia or no globus sensation. This is a chronic problem. The current episode started more than 1 year ago. The problem occurs occasionally. The problem has been gradually worsening. The symptoms are aggravated by certain foods. She has tried nothing for the symptoms. The treatment provided no relief.     Review of Systems  Gastrointestinal:  Positive for heartburn. Negative for dysphagia.  All other systems reviewed and are negative.     History reviewed. No pertinent family history. Social History   Socioeconomic History   Marital status: Married    Spouse name: Not on file   Number of children: Not on file   Years of education: Not on file   Highest education level: Not on file  Occupational History   Not on file  Tobacco Use   Smoking status: Never   Smokeless tobacco: Never  Substance and Sexual Activity   Alcohol use: No   Drug use: No   Sexual activity: Not on file  Other Topics Concern   Not on file  Social History Narrative   Not on file   Social Determinants of Health   Financial Resource Strain: Not on file  Food Insecurity: Not on file  Transportation Needs: Not on file  Physical Activity: Not on file  Stress: Not on file  Social Connections: Not on file    Objective:   Physical Exam Vitals reviewed.  Constitutional:      General: She is not in acute distress.    Appearance: She is well-developed.  HENT:     Head: Normocephalic and atraumatic.     Right Ear: Tympanic membrane normal.     Left Ear: Tympanic membrane normal.  Eyes:     Pupils: Pupils are equal, round, and  reactive to light.  Neck:     Thyroid: No thyromegaly.  Cardiovascular:     Rate and Rhythm: Normal rate and regular rhythm.     Heart sounds: Normal heart sounds. No murmur heard. Pulmonary:     Effort: Pulmonary effort is normal. No respiratory distress.     Breath sounds: Normal breath sounds. No wheezing.  Abdominal:     General: Bowel sounds are normal. There is no distension.     Palpations: Abdomen is soft.     Tenderness: There is no abdominal tenderness.  Musculoskeletal:        General: No tenderness. Normal range of motion.     Cervical back: Normal range of motion and neck supple.  Skin:    General: Skin is warm and dry.  Neurological:     Mental Status: She is alert and oriented to person, place, and time.     Cranial Nerves: No cranial nerve deficit.     Deep Tendon Reflexes: Reflexes are normal and symmetric.  Psychiatric:        Behavior: Behavior normal.        Thought Content: Thought content normal.        Judgment: Judgment normal.      BP 137/77    Pulse 97    Temp (!) 97.1  F (36.2 C) (Temporal)    Ht _0  (1.549 m)    Wt 143 lb 12.8 oz (65.2 kg)    BMI 27.17 kg/m      Assessment & Plan:  Kristen Patel comes in today with chief complaint of Gastroesophageal Reflux   Diagnosis and orders addressed:  1. Annual physical exam - Ambulatory referral to Gastroenterology - CMP14+EGFR - CBC with Differential/Platelet - Lipid panel - TSH - HIV Antibody (routine testing w rflx) - Hepatitis C antibody  2. Gastroesophageal reflux disease, unspecified whether esophagitis present -Start Omeprazole 20 mg  -Diet discussed- Avoid fried, spicy, citrus foods, caffeine and alcohol -Do not eat 2-3 hours before bedtime -Encouraged small frequent meals -Avoid NSAID's - Ambulatory referral to Gastroenterology - CMP14+EGFR - CBC with Differential/Platelet - omeprazole (PRILOSEC) 20 MG capsule; Take 1 capsule (20 mg total) by mouth daily.  Dispense: 90 capsule;  Refill: 1  3. Colon cancer screening - Ambulatory referral to Gastroenterology - CMP14+EGFR - CBC with Differential/Platelet  4. Need for hepatitis C screening test - CMP14+EGFR - CBC with Differential/Platelet - Hepatitis C antibody  5. Encounter for screening for HIV - CMP14+EGFR - CBC with Differential/Platelet - HIV Antibody (routine testing w rflx)  6. Overweight (BMI 25.0-29.9)   Labs pending Health Maintenance reviewed Diet and exercise encouraged  Follow up plan: 1 year   Evelina Dun, FNP

## 2021-05-26 LAB — CBC WITH DIFFERENTIAL/PLATELET
Basophils Absolute: 0.1 10*3/uL (ref 0.0–0.2)
Basos: 1 %
EOS (ABSOLUTE): 0.3 10*3/uL (ref 0.0–0.4)
Eos: 6 %
Hematocrit: 45 % (ref 34.0–46.6)
Hemoglobin: 14.9 g/dL (ref 11.1–15.9)
Immature Grans (Abs): 0 10*3/uL (ref 0.0–0.1)
Immature Granulocytes: 0 %
Lymphocytes Absolute: 2.2 10*3/uL (ref 0.7–3.1)
Lymphs: 36 %
MCH: 29.8 pg (ref 26.6–33.0)
MCHC: 33.1 g/dL (ref 31.5–35.7)
MCV: 90 fL (ref 79–97)
Monocytes Absolute: 0.7 10*3/uL (ref 0.1–0.9)
Monocytes: 11 %
Neutrophils Absolute: 2.9 10*3/uL (ref 1.4–7.0)
Neutrophils: 46 %
Platelets: 301 10*3/uL (ref 150–450)
RBC: 5 x10E6/uL (ref 3.77–5.28)
RDW: 11.9 % (ref 11.7–15.4)
WBC: 6.2 10*3/uL (ref 3.4–10.8)

## 2021-05-26 LAB — TSH: TSH: 4.61 u[IU]/mL — ABNORMAL HIGH (ref 0.450–4.500)

## 2021-05-26 LAB — CMP14+EGFR
ALT: 28 IU/L (ref 0–32)
AST: 21 IU/L (ref 0–40)
Albumin/Globulin Ratio: 1.4 (ref 1.2–2.2)
Albumin: 4.3 g/dL (ref 3.8–4.9)
Alkaline Phosphatase: 80 IU/L (ref 44–121)
BUN/Creatinine Ratio: 20 (ref 12–28)
BUN: 12 mg/dL (ref 8–27)
Bilirubin Total: 0.3 mg/dL (ref 0.0–1.2)
CO2: 22 mmol/L (ref 20–29)
Calcium: 9 mg/dL (ref 8.7–10.3)
Chloride: 102 mmol/L (ref 96–106)
Creatinine, Ser: 0.59 mg/dL (ref 0.57–1.00)
Globulin, Total: 3 g/dL (ref 1.5–4.5)
Glucose: 98 mg/dL (ref 70–99)
Potassium: 4.1 mmol/L (ref 3.5–5.2)
Sodium: 137 mmol/L (ref 134–144)
Total Protein: 7.3 g/dL (ref 6.0–8.5)
eGFR: 103 mL/min/{1.73_m2} (ref >59)

## 2021-05-26 LAB — LIPID PANEL
Chol/HDL Ratio: 3.5 ratio (ref 0.0–4.4)
Cholesterol, Total: 228 mg/dL — ABNORMAL HIGH (ref 100–199)
HDL: 66 mg/dL (ref >39)
LDL Chol Calc (NIH): 128 mg/dL — ABNORMAL HIGH (ref 0–99)
Triglycerides: 194 mg/dL — ABNORMAL HIGH (ref 0–149)
VLDL Cholesterol Cal: 34 mg/dL (ref 5–40)

## 2021-05-26 LAB — HIV ANTIBODY (ROUTINE TESTING W REFLEX): HIV Screen 4th Generation wRfx: NONREACTIVE

## 2021-05-26 LAB — HEPATITIS C ANTIBODY: Hep C Virus Ab: <0.1 s/co ratio (ref 0.0–0.9)

## 2021-06-22 ENCOUNTER — Encounter: Payer: Self-pay | Admitting: Nurse Practitioner

## 2021-07-01 ENCOUNTER — Encounter: Payer: Self-pay | Admitting: Nurse Practitioner

## 2021-07-01 ENCOUNTER — Ambulatory Visit (INDEPENDENT_AMBULATORY_CARE_PROVIDER_SITE_OTHER): Payer: BC Managed Care – PPO | Admitting: Nurse Practitioner

## 2021-07-01 VITALS — BP 142/74 | HR 76 | Ht 61.0 in | Wt 146.0 lb

## 2021-07-01 DIAGNOSIS — K219 Gastro-esophageal reflux disease without esophagitis: Secondary | ICD-10-CM

## 2021-07-01 NOTE — Patient Instructions (Addendum)
Acid Reflux ?--If you are taking anti-reflux ( GERD) medication be sure to take it 30 minutes before breakfast and if taking twice daily then also 30 minutes before dinner.   ?--Avoid late meals / bedtime snacks.   ?--Avoid trigger foods ( foods which you know tend to aggravate you reflux symptoms). Some of the more common triggers include spicy foods, fatty foods, acidic foods, chocolate and caffeine.  ?--Elevate the head of bed 6-8 inches on blocks or bricks. If not able to elevate the head of the bed consider purchasing a wedge pillow to sleep on. This can prevent gastric fluid from refluxing into your esophagus.    ?--Weight reduction / maintain a healthy BMI ( body mass index) may be help with reflux symptoms  ?---Sometimes with the above mentioned "lifestyle changes" patients are able to reduce the amount of GERD medications they take. Our goal would be to have you on the lowest effective dose of medication ? ?If you are age 46 or older, your body mass index should be between 23-30. Your Body mass index is 27.59 kg/m?Marland Kitchen If this is out of the aforementioned range listed, please consider follow up with your Primary Care Provider. ? ?If you are age 59 or younger, your body mass index should be between 19-25. Your Body mass index is 27.59 kg/m?Marland Kitchen If this is out of the aformentioned range listed, please consider follow up with your Primary Care Provider.  ? ?Continue Omeprazole for now. ? ?Follow up in 8 weeks  ? ?The [facility] providers would like to encourage you to use [facility] to communicate with providers for non-urgent requests or questions.  Due to long hold times on the telephone, sending your provider a message by [facility] may be a faster and more efficient way to get a response.  Please allow 48 business hours for a response.  Please remember that this is for non-urgent requests.  ? ?It was a pleasure to see you today! ? ?Thank you for trusting me with your gastrointestinal care!   ? ?Willette Cluster, NP  ? ?

## 2021-07-01 NOTE — Progress Notes (Signed)
Addendum: Reviewed and agree with assessment and management plan. Kaydan Wong M, MD  

## 2021-07-01 NOTE — Progress Notes (Signed)
? ? ?Assessment and Plan:   ? ?# Healthy 61 yo female with recent recurrence of acid regurgitation and "sour belches". No associated heartburn but still suspect GERD. Recurrence of symptoms possibly related to recent weight gain in abdomen. Also caffeine could be contributing factor. Already improving with Omeprazole and omission of coffee ?--Discussed anti-reflux measures such as avoidance of late meals / bedtime snacks, HOB elevation (or use of wedge pillow), weight reduction ( if applicable)  / maintaining a healthy BMI ( body mass index),  and avoidance of trigger foods and caffeine.  ?--Avoid bending at waist. Avoid restrictive clothing around waist.  ?--For now continue daily Omeprazole. Follow up with me in 8 weeks . Hopefully  with above lifestyle changes she will be able to reduce or even stop anti-reflux medication.  ? ?# Colon cancer screening. No FMH of colon cancer. No alarm symptoms. She is not anemic.  ?--Will schedule for screening colonoscopy when she returns for follow up.  ? ?History of Present Illness:  ? ?Patient profile:  ?Kristen Patel is a 61 y.o. female new to practice referred by PCP for GERD and colon cancer screening. She has a past medical history significant for GERD and HLD. See PMH below for any additional history. ? ?Chief complaint: acid reflux ? ?Patient says she developed problems with acid regurgitation and sour belches a few years ago. She took a prescription medication for GERD, symptoms resolved. She stopped treatment after a month and did fine until a few months ago. The acid regurgitation generally occurs just one a day but she may have sour belches a few times a day. Symptoms are not related to eating. She admits to gaining some weight in abdomen during COVID pandemic. She bends at the waist a lot during the day. Not bothered by nocturnal symptoms. No nausea or vomiting. No dysphagia. She started Omeprazole 20 mg once daily a month ago and is feeling better.  Also she  stopped drinking coffee. She does drink green tea. She has no lower Gi complaints. No blood in stool. Hgb is normal.  ? ?Previous Labs / Imaging:: ?CBC Latest Ref Rng & Units 05/25/2021  ?WBC 3.4 - 10.8 x10E3/uL 6.2  ?Hemoglobin 11.1 - 15.9 g/dL 08.6  ?Hematocrit 34.0 - 46.6 % 45.0  ?Platelets 150 - 450 x10E3/uL 301  ? ? ?No results found for: LIPASE ?CMP Latest Ref Rng & Units 05/25/2021  ?Glucose 70 - 99 mg/dL 98  ?BUN 8 - 27 mg/dL 12  ?Creatinine 0.57 - 1.00 mg/dL 5.78  ?Sodium 134 - 144 mmol/L 137  ?Potassium 3.5 - 5.2 mmol/L 4.1  ?Chloride 96 - 106 mmol/L 102  ?CO2 20 - 29 mmol/L 22  ?Calcium 8.7 - 10.3 mg/dL 9.0  ?Total Protein 6.0 - 8.5 g/dL 7.3  ?Total Bilirubin 0.0 - 1.2 mg/dL 0.3  ?Alkaline Phos 44 - 121 IU/L 80  ?AST 0 - 40 IU/L 21  ?ALT 0 - 32 IU/L 28  ? ? ?No past medical history on file. ?Past Surgical History:  ?Procedure Laterality Date  ? CESAREAN SECTION    ? ?Family History  ?Problem Relation Age of Onset  ? Healthy Mother   ? Healthy Father   ? ?Social History  ? ?Tobacco Use  ? Smoking status: Never  ? Smokeless tobacco: Never  ?Vaping Use  ? Vaping Use: Never used  ?Substance Use Topics  ? Alcohol use: No  ? Drug use: No  ? ?Current Outpatient Medications  ?Medication Sig Dispense Refill  ?  calcium citrate (CALCITRATE - DOSED IN MG ELEMENTAL CALCIUM) 950 MG tablet Take 1 tablet by mouth daily.    ? Cholecalciferol (VITAMIN D3) 3000 UNITS TABS Take by mouth.    ? fish oil-omega-3 fatty acids 1000 MG capsule Take 2 g by mouth daily.    ? omeprazole (PRILOSEC) 20 MG capsule Take 1 capsule (20 mg total) by mouth daily. 90 capsule 1  ? ?No current facility-administered medications for this visit.  ? ?No Known Allergies ? ? ?Review of Systems: ?All other systems reviewed and negative except where noted in HPI.  ? ?Physical Exam:   ? ?Wt Readings from Last 3 Encounters:  ?07/01/21 146 lb (66.2 kg)  ?05/25/21 143 lb 12.8 oz (65.2 kg)  ?08/11/17 139 lb (63 kg)  ? ? ?BP (!) 142/74   Pulse 76   Ht 5\' 1"   (1.549 m)   Wt 146 lb (66.2 kg)   BMI 27.59 kg/m?  ?Constitutional:  Generally well appearing female in no acute distress. ?Psychiatric: Pleasant. Normal mood and affect. Behavior is normal. ?EENT: Pupils normal.  Conjunctivae are normal. No scleral icterus. ?Neck supple.  ?Cardiovascular: Normal rate, regular rhythm. No edema ?Pulmonary/chest: Effort normal and breath sounds normal. No wheezing, rales or rhonchi. ?Abdominal: Soft, nondistended, nontender. Bowel sounds active throughout. There are no masses palpable. No hepatomegaly. ?Neurological: Alert and oriented to person place and time. ?Skin: Skin is warm and dry. No rashes noted. ? ? , NP  07/01/2021, 11:35 AM ? ?Cc:  ?Referring Provider ?07/03/2021, FNP ? ? ? ? ? ? ? ?

## 2021-08-22 ENCOUNTER — Other Ambulatory Visit: Payer: Self-pay | Admitting: Family

## 2021-08-22 DIAGNOSIS — K219 Gastro-esophageal reflux disease without esophagitis: Secondary | ICD-10-CM

## 2021-08-27 ENCOUNTER — Ambulatory Visit: Payer: BC Managed Care – PPO | Admitting: Family Medicine

## 2021-08-27 ENCOUNTER — Encounter: Payer: Self-pay | Admitting: Family Medicine

## 2021-08-27 ENCOUNTER — Ambulatory Visit (INDEPENDENT_AMBULATORY_CARE_PROVIDER_SITE_OTHER): Payer: BC Managed Care – PPO

## 2021-08-27 VITALS — BP 134/78 | HR 63 | Temp 97.9°F | Ht 61.0 in | Wt 142.6 lb

## 2021-08-27 DIAGNOSIS — M5442 Lumbago with sciatica, left side: Secondary | ICD-10-CM | POA: Diagnosis not present

## 2021-08-27 DIAGNOSIS — M5441 Lumbago with sciatica, right side: Secondary | ICD-10-CM

## 2021-08-27 MED ORDER — BACLOFEN 10 MG PO TABS: 10.0000 mg | ORAL_TABLET | Freq: Three times a day (TID) | ORAL | 0 refills | Status: DC | PRN

## 2021-08-27 MED ORDER — PREDNISONE 10 MG (21) PO TBPK: ORAL_TABLET | ORAL | 0 refills | Status: DC

## 2021-08-27 NOTE — Progress Notes (Signed)
? ?Subjective: ?CC: Acute back pain ?PCP: Junie Spencer, FNP ?Kristen Patel is a 61 y.o. female presenting to clinic today for: ? ?1.  Acute back pain ?Patient reports that this actually started about 2 weeks ago.  It only lasted 2 to 3 days and then resolved on its own.  Unfortunately it recurred on Monday and has not resolved.  She reports pain with bending.  She points to the low back as the area of pain and reports that it radiates down bilateral lower extremities.  Denies any numbness or tingling but does report sciatic type pain bilaterally.  She is been utilizing a topical gel for pain relief with mild pain relief.  She does not report any urinary or fecal changes.  No saddle anesthesia reported.  No preceding injury.  She drives a forklift for work and notes that this exacerbates the pain when she has to reach up and or bend.  The vibration tends to aggravate the pain. ? ? ?ROS: Per HPI ? ?No Known Allergies ?History reviewed. No pertinent past medical history. ? ?Current Outpatient Medications:  ?  calcium citrate (CALCITRATE - DOSED IN MG ELEMENTAL CALCIUM) 950 MG tablet, Take 1 tablet by mouth daily., Disp: , Rfl:  ?  Cholecalciferol (VITAMIN D3) 3000 UNITS TABS, Take by mouth., Disp: , Rfl:  ?  fish oil-omega-3 fatty acids 1000 MG capsule, Take 2 g by mouth daily., Disp: , Rfl:  ?  omeprazole (PRILOSEC) 20 MG capsule, TAKE 1 CAPSULE BY MOUTH EVERY DAY, Disp: 90 capsule, Rfl: 1 ?Social History  ? ?Socioeconomic History  ? Marital status: Married  ?  Spouse name: Not on file  ? Number of children: Not on file  ? Years of education: Not on file  ? Highest education level: Not on file  ?Occupational History  ? Not on file  ?Tobacco Use  ? Smoking status: Never  ? Smokeless tobacco: Never  ?Vaping Use  ? Vaping Use: Never used  ?Substance and Sexual Activity  ? Alcohol use: No  ? Drug use: No  ? Sexual activity: Not on file  ?Other Topics Concern  ? Not on file  ?Social History Narrative  ? Not on  file  ? ?Social Determinants of Health  ? ?Financial Resource Strain: Not on file  ?Food Insecurity: Not on file  ?Transportation Needs: Not on file  ?Physical Activity: Not on file  ?Stress: Not on file  ?Social Connections: Not on file  ?Intimate Partner Violence: Not on file  ? ?Family History  ?Problem Relation Age of Onset  ? Healthy Mother   ? Healthy Father   ? ? ?Objective: ?Office vital signs reviewed. ?BP 134/78   Pulse 63   Temp 97.9 ?F (36.6 ?C)   Ht 5\' 1"  (1.549 m)   Wt 142 lb 9.6 oz (64.7 kg)   SpO2 96%   BMI 26.94 kg/m?  ? ?Physical Examination:  ?General: Awake, alert, appears very uncomfortable ?MSK: antalgic gait and stiff station ? Lumbar spine: No midline tenderness to palpation.  She has a very visible left lean when she is standing.  She has tenderness palpation along the right side of this paraspinal muscles as well as over the SI and lumbosacral junction.  No palpable deformity.  Positive straight leg raise bilaterally ?Neuro: 4/5 RLE Strength and light touch sensation grossly intact  ? ?No results found. ? ? ?Assessment/ Plan: ?61 y.o. female  ? ?Acute bilateral low back pain with bilateral sciatica - Plan:  DG Lumbar Spine 2-3 Views, baclofen (LIORESAL) 10 MG tablet, predniSONE (STERAPRED UNI-PAK 21 TAB) 10 MG (21) TBPK tablet ? ?Suspect that she is having bulging disc based on her presentation and recurrence of pain.  We discussed potential outcomes of this.  I offered corticosteroid versus Toradol injection but she declined that she is afraid of shots.  I did obtain plain films for further evaluation.  The disc space seems to be fairly well-preserved.  She has some mild osteophyte formations noted.  Hopefully we will see improvement after physical therapy and medications.  However, if in 6 weeks she has not improved then we will plan for MRI of her spine and referral to spinal surgery for further evaluation and management.  We discussed the sedating nature of baclofen and I cautioned  her not to operate any heavy machinery while using.  Prednisone burst sent.  Given her light weight restrictions for the next couple of weeks.  We discussed red flag signs and symptoms that would warrant emergent evaluation.  She voiced good understanding will follow-up as needed or in 6 weeks ? ?No orders of the defined types were placed in this encounter. ? ?No orders of the defined types were placed in this encounter. ? ? ?Raliegh Ip, DO ?[facility] ?[phone] ? ?

## 2021-08-27 NOTE — Patient Instructions (Signed)
I suspect you may have a bulging disc causing your pain.  I have referred you to Physical therapy next door. They will call you to set up an appointment. ?I have sent 2 medications to your pharmacy: ?Prednisone: take with food as directed ?Baclofen: take up to 3 times daily if needed for pain/ spasm. This can cause sleepiness ? ?Sciatica ? ?Sciatica is pain, weakness, tingling, or loss of feeling (numbness) along the sciatic nerve. The sciatic nerve starts in the lower back and goes down the back of each leg. Sciatica usually goes away on its own or with treatment. Sometimes, sciatica may come back (recur). ?What are the causes? ?This condition happens when the sciatic nerve is pinched or has pressure put on it. This may be the result of: ?A disk in between the bones of the spine bulging out too far (herniated disk). ?Changes in the spinal disks that occur with aging. ?A condition that affects a muscle in the butt. ?Extra bone growth near the sciatic nerve. ?A break (fracture) of the area between your hip bones (pelvis). ?Pregnancy. ?Tumor. This is rare. ?What increases the risk? ?You are more likely to develop this condition if you: ?Play sports that put pressure or stress on the spine. ?Have poor strength and ease of movement (flexibility). ?Have had a back injury in the past. ?Have had back surgery. ?Sit for long periods of time. ?Do activities that involve bending or lifting over and over again. ?Are very overweight (obese). ?What are the signs or symptoms? ?Symptoms can vary from mild to very bad. They may include: ?Any of these problems in the lower back, leg, hip, or butt: ?Mild tingling, loss of feeling, or dull aches. ?Burning sensations. ?Sharp pains. ?Loss of feeling in the back of the calf or the sole of the foot. ?Leg weakness. ?Very bad back pain that makes it hard to move. ?These symptoms may get worse when you cough, sneeze, or laugh. They may also get worse when you sit or stand for long periods of  time. ?How is this treated? ?This condition often gets better without any treatment. However, treatment may include: ?Changing or cutting back on physical activity when you have pain. ?Doing exercises and stretching. ?Putting ice or heat on the affected area. ?Medicines that help: ?To relieve pain and swelling. ?To relax your muscles. ?Shots (injections) of medicines that help to relieve pain, irritation, and swelling. ?Surgery. ?Follow these instructions at home: ?Medicines ?Take over-the-counter and prescription medicines only as told by your doctor. ?Ask your doctor if the medicine prescribed to you: ?Requires you to avoid driving or using heavy machinery. ?Can cause trouble pooping (constipation). You may need to take these steps to prevent or treat trouble pooping: ?Drink enough fluids to keep your pee (urine) pale yellow. ?Take over-the-counter or prescription medicines. ?Eat foods that are high in fiber. These include beans, whole grains, and fresh fruits and vegetables. ?Limit foods that are high in fat and sugar. These include fried or sweet foods. ?Managing pain ? ?  ? ?If told, put ice on the affected area. ?Put ice in a plastic bag. ?Place a towel between your skin and the bag. ?Leave the ice on for 20 minutes, 2-3 times a day. ?If told, put heat on the affected area. Use the heat source that your doctor tells you to use, such as a moist heat pack or a heating pad. ?Place a towel between your skin and the heat source. ?Leave the heat on for 20-30 minutes. ?  Remove the heat if your skin turns bright red. This is very important if you are unable to feel pain, heat, or cold. You may have a greater risk of getting burned. ?Activity ? ?Return to your normal activities as told by your doctor. Ask your doctor what activities are safe for you. ?Avoid activities that make your symptoms worse. ?Take short rests during the day. ?When you rest for a long time, do some physical activity or stretching between periods  of rest. ?Avoid sitting for a long time without moving. Get up and move around at least one time each hour. ?Exercise and stretch regularly, as told by your doctor. ?Do not lift anything that is heavier than 10 lb (4.5 kg) while you have symptoms of sciatica. ?Avoid lifting heavy things even when you do not have symptoms. ?Avoid lifting heavy things over and over. ?When you lift objects, always lift in a way that is safe for your body. To do this, you should: ?Bend your knees. ?Keep the object close to your body. ?Avoid twisting. ?General instructions ?Stay at a healthy weight. ?Wear comfortable shoes that support your feet. Avoid wearing high heels. ?Avoid sleeping on a mattress that is too soft or too hard. You might have less pain if you sleep on a mattress that is firm enough to support your back. ?Keep all follow-up visits as told by your doctor. This is important. ?Contact a doctor if: ?You have pain that: ?Wakes you up when you are sleeping. ?Gets worse when you lie down. ?Is worse than the pain you have had in the past. ?Lasts longer than 4 weeks. ?You lose weight without trying. ?Get help right away if: ?You cannot control when you pee (urinate) or poop (have a bowel movement). ?You have weakness in any of these areas and it gets worse: ?Lower back. ?The area between your hip bones. ?Butt. ?Legs. ?You have redness or swelling of your back. ?You have a burning feeling when you pee. ?Summary ?Sciatica is pain, weakness, tingling, or loss of feeling (numbness) along the sciatic nerve. ?This condition happens when the sciatic nerve is pinched or has pressure put on it. ?Sciatica can cause pain, tingling, or loss of feeling (numbness) in the lower back, legs, hips, and butt. ?Treatment often includes rest, exercise, medicines, and putting ice or heat on the affected area. ?This information is not intended to replace advice given to you by your health care provider. Make sure you discuss any questions you have  with your health care provider. ?Document Revised: 04/23/2018 Document Reviewed: 04/23/2018 ?Elsevier Patient Education ? 2023 Elsevier Inc. ? ?

## 2021-08-31 ENCOUNTER — Ambulatory Visit: Payer: BC Managed Care – PPO | Admitting: Nurse Practitioner

## 2021-08-31 ENCOUNTER — Encounter: Payer: Self-pay | Admitting: Nurse Practitioner

## 2021-08-31 VITALS — BP 142/70 | HR 68 | Ht 61.0 in | Wt 143.0 lb

## 2021-08-31 DIAGNOSIS — K219 Gastro-esophageal reflux disease without esophagitis: Secondary | ICD-10-CM | POA: Diagnosis not present

## 2021-08-31 DIAGNOSIS — Z1211 Encounter for screening for malignant neoplasm of colon: Secondary | ICD-10-CM

## 2021-08-31 MED ORDER — NA SULFATE-K SULFATE-MG SULF 17.5-3.13-1.6 GM/177ML PO SOLN: 1.0000 | Freq: Once | ORAL | 0 refills | Status: AC

## 2021-08-31 NOTE — Patient Instructions (Addendum)
If you are age 60 or younger, your body mass index should be between 19-25. Your Body mass index is 27.02 kg/m?Marland Kitchen If this is out of the aformentioned range listed, please consider follow up with your Primary Care Provider.  ?________________________________________________________ ? ?The [facility] providers would like to encourage you to use [facility] to communicate with providers for non-urgent requests or questions.  Due to long hold times on the telephone, sending your provider a message by [facility] may be a faster and more efficient way to get a response.  Please allow 48 business hours for a response.  Please remember that this is for non-urgent requests.  ?_______________________________________________________ ? ?You have been scheduled for a colonoscopy. Please follow written instructions given to you at your visit today.  ?Please pick up your prep supplies at the pharmacy within the next 1-3 days. ?If you use inhalers (even only as needed), please bring them with you on the day of your procedure. ? ?Follow up as needed for now. ? ?Thank you for entrusting me with your care and choosing [facility]. ? ?Tye Savoy, NP-C ?

## 2021-08-31 NOTE — Progress Notes (Signed)
? ? ?Assessment  ? ?Patient Profile:  ?Kristen Patel is a 61 y.o. female known to Dr. Hilarie Fredrickson with a past medical history of GERD and HLD.  Additional medical history as listed in [place] . ? ?GERD with regurgitations / sour belches. Symptoms have resolved with daily PPI.  ? ?Colon cancer screening. Seems to be average risk.  ?No [place] of colon cancer. No alarm symptoms. She is not anemic.  Last colonoscopy ~ 10 years ago with Dr. Earlean Shawl.  ? ? ?Plan  ? ?Currently on 4th month of daily Omeprazole.  We discussed trying to get to the lowest effective dose of PPI. Once she completes this months prescription of Omeprazole she will decrease to QOD. If she continues to do well then she can slowly try to wean off the medication. We can always resume PPi if needed,  or try her on an Pepcid.  ?Anti-reflux measures reiterated today.  ?Schedule for a screening colonoscopy. The risks and benefits of colonoscopy with possible polypectomy / biopsies were discussed and the patient agrees to proceed.  ? ? ?HPI  ? ?Chief Complaint : follow up on GERD and arrange for colonoscopy ? ?Kristen Patel established care here on 3/16 for colon cancer screening and GERD symptoms. She was having belching and  ?Belching and sour taste in mouth has resolved. Taking Omeprazole daily before breakfast.  ? ?Her last colonoscopy was ~ 10 years ago with Dr.Medoff. He is out of insurance network . Takes flax seed to help with hard stool which helps. Seldom sees some blood in her stool after eating spicy foods. Seldom drinks caffeine. She just refilled Omeprazole , 90 days supply. This will be her 4th month on treatment.  ? ?Labs:  ? ?  Latest Ref Rng & Units 05/25/2021  ? 12:16 PM  ?CBC  ?WBC 3.4 - 10.8 x10E3/uL 6.2    ?Hemoglobin 11.1 - 15.9 g/dL 14.9    ?Hematocrit 34.0 - 46.6 % 45.0    ?Platelets 150 - 450 x10E3/uL 301    ? ? ? ?  Latest Ref Rng & Units 05/25/2021  ? 12:16 PM  ?Hepatic Function  ?Total Protein 6.0 - 8.5 g/dL 7.3    ?Albumin 3.8 - 4.9 g/dL 4.3     ?AST 0 - 40 IU/L 21    ?ALT 0 - 32 IU/L 28    ?Alk Phosphatase 44 - 121 IU/L 80    ?Total Bilirubin 0.0 - 1.2 mg/dL 0.3    ? ? ? ?History reviewed. No pertinent past medical history. ? ?Past Surgical History:  ?Procedure Laterality Date  ? CESAREAN SECTION    ? ? ?Current Medications, Allergies, Family History and Social History were reviewed in [employer] record. ?  ?  ?Current Outpatient Medications  ?Medication Sig Dispense Refill  ? baclofen (LIORESAL) 10 MG tablet Take 1 tablet (10 mg total) by mouth 3 (three) times daily as needed for muscle spasms (pain). 30 tablet 0  ? calcium citrate (CALCITRATE - DOSED IN MG ELEMENTAL CALCIUM) 950 MG tablet Take 1 tablet by mouth daily.    ? Cholecalciferol (VITAMIN D3) 3000 UNITS TABS Take by mouth.    ? fish oil-omega-3 fatty acids 1000 MG capsule Take 2 g by mouth daily.    ? omeprazole (PRILOSEC) 20 MG capsule TAKE 1 CAPSULE BY MOUTH EVERY DAY 90 capsule 1  ? predniSONE (STERAPRED UNI-PAK 21 TAB) 10 MG (21) TBPK tablet Take 81m by mouth day 1, 592mday 2, 4035may  3, 97m day 4, 233mday 5, 1023may 6.  Then stop. 21 tablet 0  ? vitamin E 180 MG (400 UNITS) capsule Take 400 Units by mouth daily.    ? ?No current facility-administered medications for this visit.  ? ? ?Review of Systems: ?Positive for back pain. No chest pain. No shortness of breath. No urinary complaints.  ? ? ?Physical Exam ? ?Wt Readings from Last 3 Encounters:  ?08/31/21 143 lb (64.9 kg)  ?08/27/21 142 lb 9.6 oz (64.7 kg)  ?07/01/21 146 lb (66.2 kg)  ? ? ?BP (!) 142/70   Pulse 68   Ht 5' 1"  (1.549 m)   Wt 143 lb (64.9 kg)   BMI 27.02 kg/m?  ?Constitutional:  Generally well appearing female in no acute distress. ?Psychiatric: Pleasant. Normal mood and affect. Behavior is normal. ?EENT: Pupils normal.  Conjunctivae are normal. No scleral icterus. ?Neck supple.  ?Cardiovascular: Normal rate, regular rhythm. No edema ?Pulmonary/chest: Effort normal and breath sounds normal.  No wheezing, rales or rhonchi. ?Abdominal: Soft, nondistended, nontender. Wearing a soft back brace. Bowel sounds active throughout. There are no masses palpable. No hepatomegaly. ?Neurological: Alert and oriented to person place and time. ?Skin: Skin is warm and dry. No rashes noted. ? ?PauTye SavoyP  08/31/2021, 3:18 PM ? ?Cc:  ?HawSharion BalloonNP ? ? ? ? ? ? ? ?

## 2021-09-17 ENCOUNTER — Encounter: Payer: Self-pay | Admitting: Physical Therapy

## 2021-09-17 ENCOUNTER — Ambulatory Visit: Payer: BC Managed Care – PPO | Attending: Family | Admitting: Physical Therapy

## 2021-09-17 DIAGNOSIS — M5459 Other low back pain: Secondary | ICD-10-CM | POA: Insufficient documentation

## 2021-09-17 DIAGNOSIS — M5441 Lumbago with sciatica, right side: Secondary | ICD-10-CM | POA: Insufficient documentation

## 2021-09-17 DIAGNOSIS — M5442 Lumbago with sciatica, left side: Secondary | ICD-10-CM | POA: Insufficient documentation

## 2021-09-17 DIAGNOSIS — R293 Abnormal posture: Secondary | ICD-10-CM | POA: Insufficient documentation

## 2021-09-17 NOTE — Therapy (Signed)
[facility] [address] [place], [place], [address] Phone: [phone]   Fax:  [phone]  Physical Therapy Evaluation  Patient Details  Name: Kristen Patel MRN: [id number] Date of Birth: 1960-11-03 Referring Provider (PT): Delynn Flavin DO.   Encounter Date: 09/17/2021   PT End of Session - 09/17/21 0912     Visit Number 1    Number of Visits 12    Date for PT Re-Evaluation 10/29/21    PT Start Time 0815    PT Stop Time 0901    PT Time Calculation (min) 46 min             History reviewed. No pertinent past medical history.  Past Surgical History:  Procedure Laterality Date   CESAREAN SECTION      There were no vitals filed for this visit.    Subjective Assessment - 09/17/21 0853     Subjective The patient presents to the clinic today with c/o back pain that began about a month ago.  When her back initially hurt it resolved on it's own.  A coupl of weeks later she was brushing her teeth and felt something "move" in her low back and then began to experience intense right LE pain to her anterior calf region.  She ratesa her pain at an 8/10.  She is not able to sleep comfortably.  Walking also increases her pain.  She has not found anything has really decreased her pain at this point.    Pertinent History Unremarkable.    Limitations Walking    How long can you walk comfortably? The longer she walks the worse the pain gets.    Diagnostic tests Multilevel degenerative changes, most prominent at the level of  L3-L4.    Patient Stated Goals Get out of pain.    Currently in Pain? Yes    Pain Score 8     Pain Location Back   Right LE.   Pain Orientation Right    Pain Descriptors / Indicators Aching;Shooting    Pain Type Acute pain    Pain Radiating Towards Down right LE to region of Tibialis Anterior.    Pain Onset More than a month ago    Pain Frequency Constant    Aggravating Factors  See above.    Pain Relieving  Factors Sitting.                [facility] PT Assessment - 09/17/21 0001       Assessment   Medical Diagnosis Acute bilateral LBP with bilateral Sciatica.    Referring Provider (PT) Delynn Flavin DO.    Onset Date/Surgical Date --   ~one month.     Precautions   Precautions None      Restrictions   Weight Bearing Restrictions No      Balance Screen   Has the patient fallen in the past 6 months No    Has the patient had a decrease in activity level because of a fear of falling?  No    Is the patient reluctant to leave their home because of a fear of falling?  No      Home [employer] information centre manager residence      Prior Function   Level of Independence Independent      Posture/Postural Control   Posture/Postural Control Postural limitations    Posture Comments Patient stands in some trunk flexion and leans to the left with increased weight bearing over her right  LE.      Deep Tendon Reflexes   DTR Assessment Site Patella;Achilles    Patella DTR 2+    Achilles DTR 2+      ROM / Strength   AROM / PROM / Strength AROM;Strength      AROM   Overall AROM Comments Full active lumbar flexion and extension to 15 degrees and painful.      Strength   Overall Strength Comments Essentially normal strength.      Palpation   Palpation comment Tender to palpation over right QL and upper gluteal musculature.      Special Tests   Other special tests Equal leg lengths.  Some pain reproduction with a right SLR.  (-) FABER test.      Ambulation/Gait   Gait Comments Patient walking in obvious pain with a left trunk lean and unweighting her right side.                        Objective measurements completed on examination: See above findings.       [facility] Adult PT Treatment/Exercise - 09/17/21 0001       Modalities   Modalities Electrical Stimulation;Moist Heat      Moist Heat Therapy   Number Minutes Moist Heat 20 Minutes    Moist Heat  Location Lumbar Spine      Electrical Stimulation   Electrical Stimulation Location Right LB/upper gluteal region.    Electrical Stimulation Action IFC at 80-150 Hz    Electrical Stimulation Parameters 40% scan x 20 minutes.    Electrical Stimulation Goals Pain                          PT Long Term Goals - 09/17/21 1210       PT LONG TERM GOAL #1   Title Independent with a HEP.    Time 6    Period Weeks    Status New      PT LONG TERM GOAL #2   Title Eliminate right LE pain and symptoms.    Time 6    Period Weeks    Status New      PT LONG TERM GOAL #3   Title Perform ADL's with pain not > 3/10.    Time 6    Period Weeks    Status New      PT LONG TERM GOAL #4   Title Sleep undisturbed 6 hours.    Time 6    Period Weeks    Status New                    Plan - 09/17/21 1157     Clinical Impression Statement The patient presents to [facility] with the onset of low back pain about a month ago and severe exacerbation about two weeks ago.  She has comlaints of low back pain with radiation down her right LE to the region of her Tibilias Anterior.  She leans toward the right and unweights her right side.  She has pain with active right lumbar extension.  She was tender to palpation over her right QL and upper gluteal musculature.  She had some pain reproduction with a right SLR.  Her sleep is disturbed by pain and walking increases her pain.  LE strength is essentially normal and LE DTR's are intact.  Patient will benefit from skilled physical therapy intervention to address pain and deficits.  Personal Factors and Comorbidities Other    Examination-Activity Limitations Locomotion Level;Sleep    Stability/Clinical Decision Making Evolving/Moderate complexity    Clinical Decision Making Low    Rehab Potential Good    PT Frequency 2x / week    PT Duration 6 weeks    PT Treatment/Interventions ADLs/Self Care Home Management;Cryotherapy;Electrical  Stimulation;Ultrasound;Moist Heat;Therapeutic activities;Therapeutic exercise;Manual techniques;Patient/family education;Passive range of motion;Dry needling    PT Next Visit Plan Combo e'stim/US, STW/M, S and DKTC.  May consider intermittment lumbar traction beginning at 50%, core exercise progression.    Consulted and Agree with Plan of Care Patient             Patient will benefit from skilled therapeutic intervention in order to improve the following deficits and impairments:  Abnormal gait, Difficulty walking, Pain, Increased muscle spasms, Decreased activity tolerance, Postural dysfunction  Visit Diagnosis: Other low back pain - Plan: PT plan of care cert/re-cert  Abnormal posture - Plan: PT plan of care cert/re-cert     Problem List Patient Active Problem List   Diagnosis Date Noted   Overweight (BMI 25.0-29.9) 05/25/2021   Gastroesophageal reflux disease 05/25/2021    Lian Tanori, [place], PT 09/17/2021, 12:28 PM  [facility] [address] [place], [place], [address] Phone: [phone]   Fax:  [phone]  Name: Kristen Patel MRN: [id number] Date of Birth: 1961-03-05

## 2021-09-22 ENCOUNTER — Ambulatory Visit: Payer: BC Managed Care – PPO | Admitting: Family Medicine

## 2021-09-22 ENCOUNTER — Encounter: Payer: Self-pay | Admitting: Family Medicine

## 2021-09-22 ENCOUNTER — Other Ambulatory Visit: Payer: Self-pay | Admitting: Family

## 2021-09-22 VITALS — BP 140/76 | HR 74 | Temp 97.7°F | Ht 61.0 in | Wt 139.6 lb

## 2021-09-22 DIAGNOSIS — M5441 Lumbago with sciatica, right side: Secondary | ICD-10-CM | POA: Diagnosis not present

## 2021-09-22 DIAGNOSIS — Z1231 Encounter for screening mammogram for malignant neoplasm of breast: Secondary | ICD-10-CM

## 2021-09-22 MED ORDER — NAPROXEN 500 MG PO TABS: 500.0000 mg | ORAL_TABLET | Freq: Two times a day (BID) | ORAL | 0 refills | Status: DC

## 2021-09-22 NOTE — Progress Notes (Signed)
Assessment & Plan:  1. Acute bilateral low back pain with right-sided sciatica Note written for patient to be on light duty given her symptoms are worsening at work.  Advised to continue baclofen as needed.  Sent her in a prescription for naproxen for pain.  Encouraged her to continue physical therapy. - naproxen (NAPROSYN) 500 MG tablet; Take 1 tablet (500 mg total) by mouth 2 (two) times daily with a meal.  Dispense: 60 tablet; Refill: 0   Follow up plan: Return if symptoms worsen or fail to improve.  Deliah Boston, MSN, APRN, FNP-C [facility]  Subjective:   Patient ID: Kristen Patel, female    DOB: 1961/02/28, 61 y.o.   MRN: [id number]  HPI: Kristen Patel is a 61 y.o. female presenting on 09/22/2021 for lower back pain with sciatica (Patient was seen on 5/12 and states that she is worse since she has been working still. )  Patient is here due to low back pain that radiates down her right leg. She was seen almost a month ago at which time she was referred to physical therapy and prescribed baclofen and a prednisone taper. 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  She just had her PT evaluation, last week and has her next appointment tomorrow.  She has been taking baclofen as needed in addition to her husband's naproxen.   ROS: Negative unless specifically indicated above in HPI.   Relevant past medical history reviewed and updated as indicated.   Allergies and medications reviewed and updated.   Current Outpatient Medications:    baclofen (LIORESAL) 10 MG tablet, Take 1 tablet (10 mg total) by mouth 3 (three) times daily as needed for muscle spasms (pain)., Disp: 30 tablet, Rfl: 0   calcium citrate (CALCITRATE - DOSED IN MG ELEMENTAL CALCIUM) 950 MG tablet, Take 1 tablet by mouth daily., Disp: ,  Rfl:    Cholecalciferol (VITAMIN D3) 3000 UNITS TABS, Take by mouth., Disp: , Rfl:    fish oil-omega-3 fatty acids 1000 MG capsule, Take 2 g by mouth daily., Disp: , Rfl:    omeprazole (PRILOSEC) 20 MG capsule, TAKE 1 CAPSULE BY MOUTH EVERY DAY, Disp: 90 capsule, Rfl: 1   vitamin E 180 MG (400 UNITS) capsule, Take 400 Units by mouth daily., Disp: , Rfl:   No Known Allergies  Objective:   BP 140/76   Pulse 74   Temp 97.7 F (36.5 C) (Temporal)   Ht 5\' 1"  (1.549 m)   Wt 139 lb 9.6 oz (63.3 kg)   SpO2 97%   BMI 26.38 kg/m    Physical Exam Vitals reviewed.  Constitutional:      General: She is not in acute distress.    Appearance: Normal appearance. She is not ill-appearing, toxic-appearing or diaphoretic.  HENT:     Head: Normocephalic and atraumatic.  Eyes:     General: No scleral icterus.       Right eye: No discharge.        Left eye: No discharge.     Conjunctiva/sclera: Conjunctivae normal.  Cardiovascular:     Rate and Rhythm: Normal rate.  Pulmonary:     Effort: Pulmonary effort is normal. No respiratory distress.  Musculoskeletal:        General: Normal range of motion.  Cervical back: Normal range of motion.     Lumbar back: Tenderness (right side) present. No swelling, edema, deformity, signs of trauma, lacerations or bony tenderness. Normal range of motion.  Skin:    General: Skin is warm and dry.     Capillary Refill: Capillary refill takes less than 2 seconds.  Neurological:     General: No focal deficit present.     Mental Status: She is alert and oriented to person, place, and time. Mental status is at baseline.  Psychiatric:        Mood and Affect: Mood normal.        Behavior: Behavior normal.        Thought Content: Thought content normal.        Judgment: Judgment normal.

## 2021-09-23 ENCOUNTER — Ambulatory Visit: Payer: BC Managed Care – PPO | Admitting: Physical Therapy

## 2021-09-23 ENCOUNTER — Encounter: Payer: Self-pay | Admitting: Physical Therapy

## 2021-09-23 DIAGNOSIS — Z029 Encounter for administrative examinations, unspecified: Secondary | ICD-10-CM

## 2021-09-23 DIAGNOSIS — R293 Abnormal posture: Secondary | ICD-10-CM

## 2021-09-23 DIAGNOSIS — M5459 Other low back pain: Secondary | ICD-10-CM | POA: Diagnosis not present

## 2021-09-23 NOTE — Therapy (Signed)
[facility] [place], [place], [address] Phone: [phone]   Fax:  [phone]  Physical Therapy Treatment  Patient Details  Name: Kristen Patel MRN: [id number] Date of Birth: Feb 12, 1961 Referring Provider (PT): Ronnie Doss DO.   Encounter Date: 09/23/2021   PT End of Session - 09/23/21 0856     Visit Number 2    Number of Visits 12    Date for PT Re-Evaluation 10/29/21    PT Start Time 0815    PT Stop Time 0909    PT Time Calculation (min) 54 min    Activity Tolerance Patient tolerated treatment well    Behavior During Therapy [facility] for tasks assessed/performed             History reviewed. No pertinent past medical history.  Past Surgical History:  Procedure Laterality Date   CESAREAN SECTION      There were no vitals filed for this visit.   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  She is out of work at this time.  She came into the clinic today in severe pain and pain down her right LE to foot.    Pertinent History Unremarkable.    Limitations Walking    How long can you walk comfortably? The longer she walks the worse the pain gets.    Diagnostic tests Multilevel degenerative changes, most prominent at the level of  L3-L4.    Patient Stated Goals Get out of pain.    Currently in Pain? Yes    Pain Score 10-Worst pain ever    Pain Location Back   Right LE.   Pain Orientation Right    Pain Descriptors / Indicators Aching;Shooting;Numbness    Pain Onset More than a month ago                               [facility] Adult PT Treatment/Exercise - 09/23/21 0001       Modalities   Modalities Electrical Stimulation;Moist Heat;Ultrasound      Moist Heat Therapy   Number Minutes Moist Heat 20 Minutes     Moist Heat Location Lumbar Spine      Electrical Stimulation   Electrical Stimulation Location Right LB/upper glut    Electrical Stimulation Action IFC at 80-150 Hz.    Electrical Stimulation Parameters 40% scan x 20 minutes.    Electrical Stimulation Goals Tone;Pain      Ultrasound   Ultrasound Location Right low back    Ultrasound Parameters Combo e'stim/US at 1.50 W/CM2 x 12 minutes.    Ultrasound Goals Pain      Manual Therapy   Manual Therapy Soft tissue mobilization    Soft tissue mobilization Left sdly position with folded pillow between knees for comfort:  STW/M x 11 minutes to patient's right low back (QL realease technique), SIJ region and upper gluteal area.                          PT Long Term Goals - 09/17/21 1210       PT LONG TERM GOAL #1   Title Independent with a HEP.    Time 6    Period Weeks  Status New      PT LONG TERM GOAL #2   Title Eliminate right LE pain and symptoms.    Time 6    Period Weeks    Status New      PT LONG TERM GOAL #3   Title Perform ADL's with pain not > 3/10.    Time 6    Period Weeks    Status New      PT LONG TERM GOAL #4   Title Sleep undisturbed 6 hours.    Time 6    Period Weeks    Status New                   Plan - 09/23/21 [id number]     Clinical Impression Statement The patient presented to the clinic in severe pain (10/10).  She did well with treatment today and left with a 7/10 pain-level.  No modality response following removal of modality.    Personal Factors and Comorbidities Other    Examination-Activity Limitations Locomotion Level;Sleep    Stability/Clinical Decision Making Evolving/Moderate complexity    Rehab Potential Good    PT Frequency 2x / week    PT Duration 6 weeks    PT Treatment/Interventions ADLs/Self Care Home Management;Cryotherapy;Electrical Stimulation;Ultrasound;Moist Heat;Therapeutic activities;Therapeutic exercise;Manual techniques;Patient/family  education;Passive range of motion;Dry needling    PT Next Visit Plan Combo e'stim/US, STW/M, S and DKTC.  May consider intermittment lumbar traction beginning at 50#, core exercise progression.    Consulted and Agree with Plan of Care Patient             Patient will benefit from skilled therapeutic intervention in order to improve the following deficits and impairments:  Abnormal gait, Difficulty walking, Pain, Increased muscle spasms, Decreased activity tolerance, Postural dysfunction  Visit Diagnosis: Other low back pain  Abnormal posture     Problem List Patient Active Problem List   Diagnosis Date Noted   Overweight (BMI 25.0-29.9) 05/25/2021   Gastroesophageal reflux disease 05/25/2021   Rationale for Evaluation and Treatment Rehabilitation.  Kisean Rollo, [place], PT 09/23/2021, 9:33 AM  [facility] [address] [place], [place], [address] Phone: [phone]   Fax:  [phone]  Name: Kristen Patel MRN: [id number] Date of Birth: 07-Nov-1960

## 2021-09-24 NOTE — Progress Notes (Signed)
Addendum: Reviewed and agree with assessment and management plan. Julina Altmann M, MD  

## 2021-09-30 ENCOUNTER — Ambulatory Visit: Payer: BC Managed Care – PPO | Admitting: Physical Therapy

## 2021-09-30 DIAGNOSIS — R293 Abnormal posture: Secondary | ICD-10-CM

## 2021-09-30 DIAGNOSIS — M5459 Other low back pain: Secondary | ICD-10-CM

## 2021-09-30 NOTE — Therapy (Signed)
[facility] [address] [place], [place], [address] Phone: [phone]   Fax:  [phone]  Physical Therapy Treatment  Patient Details  Name: Kristen Patel MRN: [id number] Date of Birth: [birth year] Referring Provider (PT): Delynn Flavin DO.   Encounter Date: 09/30/2021   PT End of Session - 09/30/21 0933     Visit Number 3    Number of Visits 12    Date for PT Re-Evaluation 10/29/21    PT Start Time 0815    PT Stop Time 0915    PT Time Calculation (min) 60 min    Activity Tolerance Patient tolerated treatment well    Behavior During Therapy [facility] for tasks assessed/performed             No past medical history on file.  Past Surgical History:  Procedure Laterality Date   CESAREAN SECTION      There were no vitals filed for this visit.   Subjective Assessment - 09/30/21 0933     Subjective Low back better.  CC is continued right LE pain.  Pain disturbs her sleep.    Pertinent History Unremarkable.    Limitations Walking    How long can you walk comfortably? The longer she walks the worse the pain gets.    Diagnostic tests Multilevel degenerative changes, most prominent at the level of  L3-L4.    Currently in Pain? Yes    Pain Score 6     Pain Location Back   Right LE.   Pain Orientation Right    Pain Descriptors / Indicators Aching;Shooting;Numbness    Pain Type Acute pain    Pain Onset More than a month ago                               [facility] Adult PT Treatment/Exercise - 09/30/21 0001       Exercises   Exercises Knee/Hip      Knee/Hip Exercises: Aerobic   Nustep Level 3 x 10 minutes.      Modalities   Modalities Electrical Stimulation;Moist Heat;Traction      Moist Heat Therapy   Number Minutes Moist Heat 20 Minutes    Moist Heat Location Lumbar Spine      Electrical Stimulation   Electrical Stimulation Location RT LB/upper glut    Electrical Stimulation Action IFC at 80-150  Hz.    Electrical Stimulation Parameters 40% scan x 20 minutes.    Electrical Stimulation Goals Pain;Tone      Traction   Type of Traction Lumbar    Min (lbs) 5    Max (lbs) 55    Hold Time 99    Rest Time 5    Time 15                          PT Long Term Goals - 09/17/21 1210       PT LONG TERM GOAL #1   Title Independent with a HEP.    Time 6    Period Weeks    Status New      PT LONG TERM GOAL #2   Title Eliminate right LE pain and symptoms.    Time 6    Period Weeks    Status New      PT LONG TERM GOAL #3   Title Perform ADL's with pain not > 3/10.    Time 6  Period Weeks    Status New      PT LONG TERM GOAL #4   Title Sleep undisturbed 6 hours.    Time 6    Period Weeks    Status New                   Plan - 09/30/21 0939     Clinical Impression Statement Patient presenting to the clinic wiht a limping/antalgic gait.  She did very well with treatment today including intermittment lumbar traction at 55#.  She left the clinic feeling better with improved gait cycle.    Personal Factors and Comorbidities Other    Examination-Activity Limitations Locomotion Level;Sleep    Stability/Clinical Decision Making Evolving/Moderate complexity    Rehab Potential Good    PT Frequency 2x / week    PT Duration 6 weeks    PT Treatment/Interventions ADLs/Self Care Home Management;Cryotherapy;Electrical Stimulation;Ultrasound;Moist Heat;Therapeutic activities;Therapeutic exercise;Manual techniques;Patient/family education;Passive range of motion;Dry needling    PT Next Visit Plan Intermittment lumbar traction beginning at 60#, core exercise progression.    Consulted and Agree with Plan of Care Patient             Patient will benefit from skilled therapeutic intervention in order to improve the following deficits and impairments:  Abnormal gait, Difficulty walking, Pain, Increased muscle spasms, Decreased activity tolerance, Postural  dysfunction  Visit Diagnosis: Other low back pain  Abnormal posture     Problem List Patient Active Problem List   Diagnosis Date Noted   Overweight (BMI 25.0-29.9) 05/25/2021   Gastroesophageal reflux disease 05/25/2021   Rationale for Evaluation and Treatment Rehabilitation.  Ranesha Val, [place], PT 09/30/2021, 10:23 AM  [facility] [address] [place], [place], [address] Phone: [phone]   Fax:  [phone]  Name: Kristen Patel MRN: [id number] Date of Birth: 1960/11/27

## 2021-10-05 ENCOUNTER — Telehealth: Payer: Self-pay | Admitting: Family

## 2021-10-05 NOTE — Telephone Encounter (Signed)
FMLA ppw is on Kristen Patel's desk for signature since she is the last provider she saw for this. It was placed on her desk on 09/30/21 which was her first day back in the office since it was dropped off.

## 2021-10-06 ENCOUNTER — Ambulatory Visit: Payer: BC Managed Care – PPO | Admitting: Physical Therapy

## 2021-10-06 ENCOUNTER — Telehealth: Payer: Self-pay | Admitting: Family

## 2021-10-06 DIAGNOSIS — M5459 Other low back pain: Secondary | ICD-10-CM | POA: Diagnosis not present

## 2021-10-06 DIAGNOSIS — R293 Abnormal posture: Secondary | ICD-10-CM

## 2021-10-06 NOTE — Telephone Encounter (Signed)
Left message to call back  

## 2021-10-06 NOTE — Telephone Encounter (Signed)
Pt picked up ppw

## 2021-10-06 NOTE — Therapy (Signed)
[facility] [address] [place], [place], [address] Phone: [phone]   Fax:  [phone]  Physical Therapy Treatment  Patient Details  Name: Kristen Patel MRN: [id number] Date of Birth: 31-Jan-1961 Referring Provider (PT): Delynn Flavin DO.   Encounter Date: 10/06/2021   PT End of Session - 10/06/21 1215     Visit Number 4    Number of Visits 12    Date for PT Re-Evaluation 10/29/21    PT Start Time 0900    PT Stop Time 0946    PT Time Calculation (min) 46 min    Activity Tolerance Patient tolerated treatment well    Behavior During Therapy [facility] for tasks assessed/performed             No past medical history on file.  Past Surgical History:  Procedure Laterality Date   CESAREAN SECTION      There were no vitals filed for this visit.   Subjective Assessment - 10/06/21 1214     Subjective Continued right LE pain.    Pertinent History Unremarkable.    Limitations Walking    How long can you walk comfortably? The longer she walks the worse the pain gets.    Patient Stated Goals Get out of pain.    Currently in Pain? Yes    Pain Score 7     Pain Location Back   Right lower extremity.   Pain Orientation Right    Pain Descriptors / Indicators Aching;Shooting;Numbness    Pain Type Acute pain    Pain Onset More than a month ago                               [facility] Adult PT Treatment/Exercise - 10/06/21 0001       Exercises   Exercises Knee/Hip      Knee/Hip Exercises: Aerobic   Nustep Level 4 x 10 minutes.      Traction   Type of Traction Lumbar    Min (lbs) 5    Max (lbs) 60    Hold Time 99    Rest Time 5    Time 15      Manual Therapy   Manual Therapy Joint mobilization;Soft tissue mobilization    Joint Mobilization In supine:  Gentle right femoral traction sustained and with oscillations x 5 minutes.    Soft tissue mobilization STW/M x 4 minutes with ischemic release to right  low back.                          PT Long Term Goals - 09/17/21 1210       PT LONG TERM GOAL #1   Title Independent with a HEP.    Time 6    Period Weeks    Status New      PT LONG TERM GOAL #2   Title Eliminate right LE pain and symptoms.    Time 6    Period Weeks    Status New      PT LONG TERM GOAL #3   Title Perform ADL's with pain not > 3/10.    Time 6    Period Weeks    Status New      PT LONG TERM GOAL #4   Title Sleep undisturbed 6 hours.    Time 6    Period Weeks    Status New  Plan - 10/06/21 1218     Clinical Impression Statement Patient tolerated a 5# increase in intermittment lumbar traction without complaint though following treatment she said she felt about the same.    Personal Factors and Comorbidities Other    Examination-Activity Limitations Locomotion Level;Sleep    Stability/Clinical Decision Making Evolving/Moderate complexity    Rehab Potential Good    PT Frequency 2x / week    PT Duration 6 weeks    PT Treatment/Interventions ADLs/Self Care Home Management;Cryotherapy;Electrical Stimulation;Ultrasound;Moist Heat;Therapeutic activities;Therapeutic exercise;Manual techniques;Patient/family education;Passive range of motion;Dry needling    PT Next Visit Plan Intermittment lumbar traction 65#.    Consulted and Agree with Plan of Care Patient             Patient will benefit from skilled therapeutic intervention in order to improve the following deficits and impairments:  Abnormal gait, Difficulty walking, Pain, Increased muscle spasms, Decreased activity tolerance, Postural dysfunction  Visit Diagnosis: Other low back pain  Abnormal posture     Problem List Patient Active Problem List   Diagnosis Date Noted   Overweight (BMI 25.0-29.9) 05/25/2021   Gastroesophageal reflux disease 05/25/2021   Rationale for Evaluation and Treatment Rehabilitation.  Pacen Watford, [place], PT 10/06/2021, 12:21  PM  [facility] [address] [place], [place], [address] Phone: [phone]   Fax:  [phone]  Name: MARIAM HELBERT MRN: [id number] Date of Birth: [birth year]

## 2021-10-06 NOTE — Telephone Encounter (Signed)
REFERRAL REQUEST Telephone Note  Have you been seen at our office for this problem? YES (Advise that they may need an appointment with their PCP before a referral can be done)  Reason for Referral: Lower right back pain and wants MRI Referral discussed with patient: YES for only PT and PT is not helping  Best contact number of patient for referral team: [phone]    Has patient been seen by a specialist for this issue before: NO  Patient provider preference for referral: Jeani Hawking Patient location preference for referral: [place]   Patient notified that referrals can take up to a week or longer to process. If they haven't heard anything within a week they should call back and speak with the referral department.

## 2021-10-06 NOTE — Telephone Encounter (Signed)
[facility] patient  She was seen on 08/27/21 by Dr. Nadine Counts for back pain.  Physical therapy was ordered.  Notes discuss referral for MRI and spine specialist if this does not help.  Patient would like to go ahead with MRI, does not feel pain has improved with therapy.

## 2021-10-12 ENCOUNTER — Ambulatory Visit: Payer: BC Managed Care – PPO | Admitting: Family Medicine

## 2021-10-12 ENCOUNTER — Encounter: Payer: Self-pay | Admitting: Family Medicine

## 2021-10-12 VITALS — BP 126/70 | HR 68 | Temp 97.9°F | Ht 61.0 in | Wt 140.1 lb

## 2021-10-12 DIAGNOSIS — M47816 Spondylosis without myelopathy or radiculopathy, lumbar region: Secondary | ICD-10-CM | POA: Diagnosis not present

## 2021-10-14 ENCOUNTER — Ambulatory Visit: Payer: BC Managed Care – PPO

## 2021-10-14 ENCOUNTER — Ambulatory Visit: Payer: BC Managed Care – PPO | Admitting: Family

## 2021-10-14 DIAGNOSIS — M5459 Other low back pain: Secondary | ICD-10-CM | POA: Diagnosis not present

## 2021-10-14 DIAGNOSIS — R293 Abnormal posture: Secondary | ICD-10-CM

## 2021-10-14 NOTE — Therapy (Signed)
[facility] [address] [place], [place], [address] Phone: [phone]   Fax:  [phone]  Physical Therapy Treatment  Patient Details  Name: Kristen Patel MRN: [id number] Date of Birth: 13-Apr-1961 Referring Provider (PT): Delynn Flavin DO.   Encounter Date: 10/14/2021   PT End of Session - 10/14/21 0959     Visit Number 5    Number of Visits 12    Date for PT Re-Evaluation 10/29/21    PT Start Time 0945    PT Stop Time 1045    PT Time Calculation (min) 60 min    Activity Tolerance Patient tolerated treatment well    Behavior During Therapy [facility] for tasks assessed/performed             History reviewed. No pertinent past medical history.  Past Surgical History:  Procedure Laterality Date   CESAREAN SECTION      There were no vitals filed for this visit.   Subjective Assessment - 10/14/21 0950     Subjective Patient reports that her back and right leg are still hurting and numb. She notes that her past treatment have been helping some.    Pertinent History Unremarkable.    Limitations Walking    How long can you walk comfortably? The longer she walks the worse the pain gets.    Patient Stated Goals Get out of pain.    Currently in Pain? Yes    Pain Score 6     Pain Location Back    Pain Radiating Towards down RLE    Pain Onset More than a month ago                               [facility] Adult PT Treatment/Exercise - 10/14/21 0001       Exercises   Exercises Lumbar      Lumbar Exercises: Stretches   Lower Trunk Rotation Other (comment)   20 reps     Lumbar Exercises: Supine   Bridge 15 reps      Knee/Hip Exercises: Aerobic   Nustep L4 x 15 minutes      Knee/Hip Exercises: Supine   Hip Adduction Isometric Both;20 reps   5 seconds     Modalities   Modalities Traction      Traction   Type of Traction Lumbar    Min (lbs) 5    Max (lbs) 60    Hold Time 99    Rest Time 5    Time  15                          PT Long Term Goals - 09/17/21 1210       PT LONG TERM GOAL #1   Title Independent with a HEP.    Time 6    Period Weeks    Status New      PT LONG TERM GOAL #2   Title Eliminate right LE pain and symptoms.    Time 6    Period Weeks    Status New      PT LONG TERM GOAL #3   Title Perform ADL's with pain not > 3/10.    Time 6    Period Weeks    Status New      PT LONG TERM GOAL #4   Title Sleep undisturbed 6 hours.    Time 6  Period Weeks    Status New                   Plan - 10/14/21 1033     Clinical Impression Statement Patient was attempted to be introduced to multiple new interventions. However, she experienced high pain irritability with each of today's new interventions as they increased her familiar right lower extremity and low back pain. Traction was utilized to reduce her familiar symptoms with moderate effectiveness. She reported feeling fine upon the conclusion of treatment. She continues to require skilled physical therapy to address her remaining impairments to maximize her functional mobiltiy.    Personal Factors and Comorbidities Other    Examination-Activity Limitations Locomotion Level;Sleep    Stability/Clinical Decision Making Evolving/Moderate complexity    Rehab Potential Good    PT Frequency 2x / week    PT Duration 6 weeks    PT Treatment/Interventions ADLs/Self Care Home Management;Cryotherapy;Electrical Stimulation;Ultrasound;Moist Heat;Therapeutic activities;Therapeutic exercise;Manual techniques;Patient/family education;Passive range of motion;Dry needling    PT Next Visit Plan Intermittment lumbar traction 65#.    Consulted and Agree with Plan of Care Patient             Patient will benefit from skilled therapeutic intervention in order to improve the following deficits and impairments:  Abnormal gait, Difficulty walking, Pain, Increased muscle spasms, Decreased activity tolerance,  Postural dysfunction  Visit Diagnosis: Other low back pain  Abnormal posture     Problem List Patient Active Problem List   Diagnosis Date Noted   Overweight (BMI 25.0-29.9) 05/25/2021   Gastroesophageal reflux disease 05/25/2021   Rationale for Evaluation and Treatment Rehabilitation   Granville Lewis, PT 10/14/2021, 11:20 AM  [facility] [address] [place], [place], [address] Phone: [phone]   Fax:  [phone]  Name: Kristen Patel MRN: [id number] Date of Birth: 15-Apr-1961

## 2021-10-21 ENCOUNTER — Ambulatory Visit: Payer: BC Managed Care – PPO | Attending: Family

## 2021-10-21 DIAGNOSIS — R293 Abnormal posture: Secondary | ICD-10-CM | POA: Diagnosis present

## 2021-10-21 DIAGNOSIS — M5459 Other low back pain: Secondary | ICD-10-CM | POA: Insufficient documentation

## 2021-10-21 NOTE — Therapy (Signed)
OUTPATIENT PHYSICAL THERAPY TREATMENT NOTE   Patient Name: Kristen Patel MRN: [id number] DOB:[birth year], 61 y.o., female Today's Date: 10/21/2021  PCP: Jannifer Rodney, FNP REFERRING PROVIDER: Junie Spencer, FNP   PT End of Session - 10/21/21 [phone]     Visit Number 6    Number of Visits 12    Date for PT Re-Evaluation 10/29/21    PT Start Time 0815    PT Stop Time 0906    PT Time Calculation (min) 51 min    Activity Tolerance Patient tolerated treatment well    Behavior During Therapy [facility] for tasks assessed/performed             History reviewed. No pertinent past medical history. Past Surgical History:  Procedure Laterality Date   CESAREAN SECTION     Patient Active Problem List   Diagnosis Date Noted   Overweight (BMI 25.0-29.9) 05/25/2021   Gastroesophageal reflux disease 05/25/2021    REFERRING DIAG: Acute bilateral low back pain with bilateral sciatica  THERAPY DIAG:  Other low back pain  Abnormal posture  Rationale for Evaluation and Treatment Rehabilitation  PERTINENT HISTORY: unremarkable  PRECAUTIONS: none  SUBJECTIVE:  Patient reports that her right lower leg is still numb. She notes that she could hardly walk after trying isometric hip adduction at her last appointment. She notes that she can hardly sleep without having to take pain medication. PAIN:  Are you having pain? Yes: Pain location: right lower leg Pain description: numb      TODAY'S TREATMENT:                                    7/6 EXERCISE LOG  Exercise Repetitions and Resistance Comments  Nustep  L4 x 16 minutes                     Blank cell = exercise not performed today   Modalities- no adverse reaction or redness with today's modalities  Date:  Unattended Estim: Lumbar, pre-mod, 15 mins, Pain   Manual Therapy Soft Tissue Mobilization: right lumbar paraspinals and QL, with Biofreeze for pain relief            PATIENT EDUCATION: Education details:  following medication recommendations from her PCP, contacting her referring provider regarding her MRI, walking short distances for exercise rather than long distances to avoid significantly increasing her pain Person educated: Patient Education method: Explanation Education comprehension: verbalized understanding   HOME EXERCISE PROGRAM:      PT Long Term Goals - 09/17/21 1210       PT LONG TERM GOAL #1   Title Independent with a HEP.    Time 6    Period Weeks    Status New      PT LONG TERM GOAL #2   Title Eliminate right LE pain and symptoms.    Time 6    Period Weeks    Status New      PT LONG TERM GOAL #3   Title Perform ADL's with pain not > 3/10.    Time 6    Period Weeks    Status New      PT LONG TERM GOAL #4   Title Sleep undisturbed 6 hours.    Time 6    Period Weeks    Status New              Plan - 10/21/21 3382  Clinical Impression Statement Patient presented to treatment reporting increased pain after her last appointment following the introduction of new active interventions. Treatment was limited to the Nustep and electrical stimulation to her right lumbar paraspinals due to her high pain irritability. She reported feeling a little better upon the conclusion of treatment. She may benefit from additional medical evaluation due to her high pain severity and irritability.    Personal Factors and Comorbidities Other    Examination-Activity Limitations Locomotion Level;Sleep    Stability/Clinical Decision Making Evolving/Moderate complexity    Rehab Potential Good    PT Frequency 2x / week    PT Duration 6 weeks    PT Treatment/Interventions ADLs/Self Care Home Management;Cryotherapy;Electrical Stimulation;Ultrasound;Moist Heat;Therapeutic activities;Therapeutic exercise;Manual techniques;Patient/family education;Passive range of motion;Dry needling    PT Next Visit Plan Intermittment lumbar traction 65#.    Consulted and Agree with Plan of Care  Patient               Granville Lewis, PT 10/21/2021, 9:22 AM

## 2021-10-26 ENCOUNTER — Telehealth: Payer: Self-pay | Admitting: Family

## 2021-10-26 NOTE — Telephone Encounter (Signed)
Pt has not had the MRI. She was calling to make [place] aware that she has not been scheduled yet.  Will forward message to referral to question why it has not been scheduled and to do so.

## 2021-10-28 ENCOUNTER — Ambulatory Visit: Payer: BC Managed Care – PPO

## 2021-10-28 ENCOUNTER — Encounter: Payer: BC Managed Care – PPO | Admitting: Internal Medicine

## 2021-11-02 NOTE — Telephone Encounter (Signed)
Patient came in the office to ask  about her MRI. Verified that the card we have on file matches the one that the patient has. Please call back and advise.

## 2021-11-04 ENCOUNTER — Encounter: Payer: BC Managed Care – PPO | Admitting: Internal Medicine

## 2021-11-05 ENCOUNTER — Telehealth: Payer: Self-pay | Admitting: Family

## 2021-11-08 NOTE — Telephone Encounter (Signed)
Please try to call patient and let her know about her insurance.

## 2021-11-08 NOTE — Telephone Encounter (Signed)
Patient aware. She will call insurance

## 2021-11-09 NOTE — Telephone Encounter (Signed)
Pt called stating that she spoke with her insurance and was told that the MRI doesn't need certification and that pt can just have the MRI done and send them the claim and they will take care of it. Also said she was told that our office can call her insurance back if there are any issues. Pt says whoever she talked to was able to find her info in their system.

## 2021-11-15 ENCOUNTER — Ambulatory Visit (HOSPITAL_COMMUNITY)
Admission: RE | Admit: 2021-11-15 | Discharge: 2021-11-15 | Disposition: A | Discharge status: Home / Self Care | Payer: BC Managed Care – PPO | Source: Ambulatory Visit | Attending: Family Medicine | Admitting: Family Medicine

## 2021-11-15 DIAGNOSIS — M47816 Spondylosis without myelopathy or radiculopathy, lumbar region: Secondary | ICD-10-CM | POA: Diagnosis present

## 2021-11-18 ENCOUNTER — Ambulatory Visit (INDEPENDENT_AMBULATORY_CARE_PROVIDER_SITE_OTHER): Payer: BC Managed Care – PPO | Admitting: Family

## 2021-11-18 ENCOUNTER — Ambulatory Visit
Admission: RE | Admit: 2021-11-18 | Discharge: 2021-11-18 | Disposition: A | Discharge status: Home / Self Care | Payer: BC Managed Care – PPO | Source: Ambulatory Visit | Attending: Family | Admitting: Family

## 2021-11-18 ENCOUNTER — Ambulatory Visit: Payer: BC Managed Care – PPO

## 2021-11-18 ENCOUNTER — Encounter: Payer: Self-pay | Admitting: Family

## 2021-11-18 VITALS — BP 136/64 | HR 80 | Temp 97.5°F | Ht 61.0 in | Wt 143.0 lb

## 2021-11-18 DIAGNOSIS — M48061 Spinal stenosis, lumbar region without neurogenic claudication: Secondary | ICD-10-CM

## 2021-11-18 DIAGNOSIS — M5441 Lumbago with sciatica, right side: Secondary | ICD-10-CM

## 2021-11-18 DIAGNOSIS — M5126 Other intervertebral disc displacement, lumbar region: Secondary | ICD-10-CM | POA: Diagnosis not present

## 2021-11-18 DIAGNOSIS — Z1231 Encounter for screening mammogram for malignant neoplasm of breast: Secondary | ICD-10-CM

## 2021-11-18 MED ORDER — BACLOFEN 10 MG PO TABS: 10.0000 mg | ORAL_TABLET | Freq: Three times a day (TID) | ORAL | 2 refills | Status: DC | PRN

## 2021-11-18 MED ORDER — NAPROXEN 500 MG PO TABS: 500.0000 mg | ORAL_TABLET | Freq: Two times a day (BID) | ORAL | 2 refills | Status: DC

## 2021-11-18 NOTE — Patient Instructions (Signed)

## 2021-11-18 NOTE — Progress Notes (Signed)
Subjective:    Patient ID: Kristen Patel, female    DOB: 08/12/1960, 61 y.o.   MRN: [id number]  Chief Complaint  Patient presents with   Medication Consultation   Pt presents to the office today with continued back pain and right numbness. She had a MRI on 11/15/21 that showed, "Central and right-sided disc protrusion L4-5. Moderate spinal stenosis and moderate subarticular stenosis bilaterally right greater than left Small central disc protrusion L5-S1 without neural impingement."   States her pain is improving. She completed PT and doing home exercises. Does not wish to see Neurosurgeon at this time. Requesting to go back to work.  Back Pain This is a chronic problem. The current episode started more than 1 month ago. The problem occurs intermittently. The problem has been waxing and waning since onset. The pain is present in the lumbar spine. The pain is at a severity of 4/10. The pain is moderate. Associated symptoms include leg pain, tingling and weakness. She has tried NSAIDs, home exercises and muscle relaxant for the symptoms. The treatment provided mild relief.      Review of Systems  Musculoskeletal:  Positive for back pain.  Neurological:  Positive for tingling and weakness.  All other systems reviewed and are negative.      Objective:   Physical Exam Vitals reviewed.  Constitutional:      General: She is not in acute distress.    Appearance: She is well-developed.  HENT:     Head: Normocephalic and atraumatic.  Eyes:     Pupils: Pupils are equal, round, and reactive to light.  Neck:     Thyroid: No thyromegaly.  Cardiovascular:     Rate and Rhythm: Normal rate and regular rhythm.     Heart sounds: Normal heart sounds. No murmur heard. Pulmonary:     Effort: Pulmonary effort is normal. No respiratory distress.     Breath sounds: Normal breath sounds. No wheezing.  Abdominal:     General: Bowel sounds are normal. There is no distension.     Palpations:  Abdomen is soft.     Tenderness: There is no abdominal tenderness.  Musculoskeletal:        General: Tenderness present. Normal range of motion.     Cervical back: Normal range of motion and neck supple.     Comments: Mild pain lumbar with flexion, positive SLR   Skin:    General: Skin is warm and dry.  Neurological:     Mental Status: She is alert and oriented to person, place, and time.     Cranial Nerves: No cranial nerve deficit.     Deep Tendon Reflexes: Reflexes are normal and symmetric.  Psychiatric:        Behavior: Behavior normal.        Thought Content: Thought content normal.        Judgment: Judgment normal.      BP 136/64   Pulse 80   Temp (!) 97.5 F (36.4 C) (Temporal)   Ht 5\' 1"  (1.549 m)   Wt 143 lb (64.9 kg)   BMI 27.02 kg/m       Assessment & Plan:  CELITA ARON comes in today with chief complaint of Medication Consultation   Diagnosis and orders addressed:  1. Acute right-sided low back pain with right-sided sciatica - naproxen (NAPROSYN) 500 MG tablet; Take 1 tablet (500 mg total) by mouth 2 (two) times daily with a meal.  Dispense: 180 tablet; Refill: 2 -  baclofen (LIORESAL) 10 MG tablet; Take 1 tablet (10 mg total) by mouth 3 (three) times daily as needed for muscle spasms (pain).  Dispense: 90 tablet; Refill: 2  2. Protruded lumbar disc - naproxen (NAPROSYN) 500 MG tablet; Take 1 tablet (500 mg total) by mouth 2 (two) times daily with a meal.  Dispense: 180 tablet; Refill: 2 - baclofen (LIORESAL) 10 MG tablet; Take 1 tablet (10 mg total) by mouth 3 (three) times daily as needed for muscle spasms (pain).  Dispense: 90 tablet; Refill: 2  3. Spinal stenosis of lumbar region, unspecified whether neurogenic claudication present - naproxen (NAPROSYN) 500 MG tablet; Take 1 tablet (500 mg total) by mouth 2 (two) times daily with a meal.  Dispense: 180 tablet; Refill: 2 - baclofen (LIORESAL) 10 MG tablet; Take 1 tablet (10 mg total) by mouth 3 (three)  times daily as needed for muscle spasms (pain).  Dispense: 90 tablet; Refill: 2   Continue home exercises  Naprosyn and baclofen  as needed  Refuses referral at this time  Requesting note to go back to work Diet and exercise encouraged  Follow up plan: As needed   Jannifer Rodney, FNP

## 2021-12-10 ENCOUNTER — Other Ambulatory Visit: Payer: Self-pay | Admitting: Family

## 2021-12-10 DIAGNOSIS — M48061 Spinal stenosis, lumbar region without neurogenic claudication: Secondary | ICD-10-CM

## 2021-12-10 DIAGNOSIS — M5126 Other intervertebral disc displacement, lumbar region: Secondary | ICD-10-CM

## 2021-12-10 DIAGNOSIS — M5441 Lumbago with sciatica, right side: Secondary | ICD-10-CM

## 2022-03-01 ENCOUNTER — Ambulatory Visit (INDEPENDENT_AMBULATORY_CARE_PROVIDER_SITE_OTHER): Payer: BC Managed Care – PPO

## 2022-03-01 ENCOUNTER — Ambulatory Visit: Payer: BC Managed Care – PPO | Admitting: Family

## 2022-03-01 ENCOUNTER — Encounter: Payer: Self-pay | Admitting: Family

## 2022-03-01 VITALS — BP 120/74 | HR 66 | Temp 98.0°F | Ht 61.0 in | Wt 137.6 lb

## 2022-03-01 DIAGNOSIS — G4700 Insomnia, unspecified: Secondary | ICD-10-CM | POA: Diagnosis not present

## 2022-03-01 DIAGNOSIS — J40 Bronchitis, not specified as acute or chronic: Secondary | ICD-10-CM

## 2022-03-01 DIAGNOSIS — J4521 Mild intermittent asthma with (acute) exacerbation: Secondary | ICD-10-CM | POA: Diagnosis not present

## 2022-03-01 MED ORDER — TRAZODONE HCL 50 MG PO TABS: 50.0000 mg | ORAL_TABLET | Freq: Every day | ORAL | 1 refills | Status: DC

## 2022-03-01 MED ORDER — BENZONATATE 200 MG PO CAPS: 200.0000 mg | ORAL_CAPSULE | Freq: Three times a day (TID) | ORAL | 1 refills | Status: DC | PRN

## 2022-03-01 MED ORDER — ALBUTEROL SULFATE HFA 108 (90 BASE) MCG/ACT IN AERS: 2.0000 | INHALATION_SPRAY | Freq: Four times a day (QID) | RESPIRATORY_TRACT | 0 refills | Status: DC | PRN

## 2022-03-01 MED ORDER — PREDNISONE 10 MG (21) PO TBPK: ORAL_TABLET | ORAL | 0 refills | Status: DC

## 2022-03-01 NOTE — Progress Notes (Signed)
Subjective:    Patient ID: Kristen Patel, female    DOB: 1960/12/11, 61 y.o.   MRN: [id number]  Chief Complaint  Patient presents with   Cough    Over a week was on vaccay seen on vacation she was in [place] was given abx    PT presents to the office today with a cough that started 02/10/22. She was in  [place] She went to Urgent Care there and was given Augmentin for 7 days.  Cough This is a new problem. The current episode started 1 to 4 weeks ago. The problem has been waxing and waning. The problem occurs every few minutes. The cough is Productive of sputum. Associated symptoms include chest pain, nasal congestion, postnasal drip, shortness of breath and wheezing. Pertinent negatives include no chills, ear congestion, ear pain, fever, headaches or myalgias. She has tried rest and OTC cough suppressant (Augmentin) for the symptoms. The treatment provided mild relief. Her past medical history is significant for asthma.  Insomnia Primary symptoms: difficulty falling asleep, frequent awakening.   The current episode started more than one year. The onset quality is gradual. The problem occurs intermittently.      Review of Systems  Constitutional:  Negative for chills and fever.  HENT:  Positive for postnasal drip. Negative for ear pain.   Respiratory:  Positive for cough, shortness of breath and wheezing.   Cardiovascular:  Positive for chest pain.  Musculoskeletal:  Negative for myalgias.  Neurological:  Negative for headaches.  Psychiatric/Behavioral:  The patient has insomnia.   All other systems reviewed and are negative.      Objective:   Physical Exam Vitals reviewed.  Constitutional:      General: She is not in acute distress.    Appearance: She is well-developed.  HENT:     Head: Normocephalic and atraumatic.     Right Ear: Tympanic membrane normal.     Left Ear: Tympanic membrane normal.  Eyes:     Pupils: Pupils are equal, round, and reactive to light.   Neck:     Thyroid: No thyromegaly.  Cardiovascular:     Rate and Rhythm: Normal rate and regular rhythm.     Heart sounds: Normal heart sounds. No murmur heard. Pulmonary:     Effort: Pulmonary effort is normal. No respiratory distress.     Breath sounds: Rhonchi present. No wheezing.  Abdominal:     General: Bowel sounds are normal. There is no distension.     Palpations: Abdomen is soft.     Tenderness: There is no abdominal tenderness.  Musculoskeletal:        General: No tenderness. Normal range of motion.     Cervical back: Normal range of motion and neck supple.  Skin:    General: Skin is warm and dry.  Neurological:     Mental Status: She is alert and oriented to person, place, and time.     Cranial Nerves: No cranial nerve deficit.     Deep Tendon Reflexes: Reflexes are normal and symmetric.  Psychiatric:        Behavior: Behavior normal.        Thought Content: Thought content normal.        Judgment: Judgment normal.     BP 120/74   Pulse 66   Temp 98 F (36.7 C) (Temporal)   Ht 5\' 1"  (1.549 m)   Wt 137 lb 9.6 oz (62.4 kg)   SpO2 96%   BMI 26.00 kg/m  Assessment & Plan:  Kristen Patel comes in today with chief complaint of Cough (Over a week was on vaccay seen on vacation she was in [place] was given abx )   Diagnosis and orders addressed:  1. Mild intermittent asthma with acute exacerbation Start prednisone today - Take meds as prescribed - Use a cool mist humidifier  -Use saline nose sprays frequently -Force fluids -For any cough or congestion  Use plain Mucinex- regular strength or max strength is fine -For fever or aces or pains- take tylenol or ibuprofen. -Throat lozenges if help -Follow up if symptoms worsen or do not improve  - predniSONE (STERAPRED UNI-PAK 21 TAB) 10 MG (21) TBPK tablet; Use as directed  Dispense: 21 tablet; Refill: 0 - benzonatate (TESSALON) 200 MG capsule; Take 1 capsule (200 mg total) by mouth 3 (three)  times daily as needed.  Dispense: 30 capsule; Refill: 1 - albuterol (VENTOLIN HFA) 108 (90 Base) MCG/ACT inhaler; Inhale 2 puffs into the lungs every 6 (six) hours as needed for wheezing or shortness of breath.  Dispense: 8 g; Refill: 0 - DG Chest 2 View  2. Bronchitis - predniSONE (STERAPRED UNI-PAK 21 TAB) 10 MG (21) TBPK tablet; Use as directed  Dispense: 21 tablet; Refill: 0 - benzonatate (TESSALON) 200 MG capsule; Take 1 capsule (200 mg total) by mouth 3 (three) times daily as needed.  Dispense: 30 capsule; Refill: 1 - albuterol (VENTOLIN HFA) 108 (90 Base) MCG/ACT inhaler; Inhale 2 puffs into the lungs every 6 (six) hours as needed for wheezing or shortness of breath.  Dispense: 8 g; Refill: 0 - DG Chest 2 View  3. Insomnia, unspecified type Start trazodone  Sleep ritual  - traZODone (DESYREL) 50 MG tablet; Take 1-2 tablets (50-100 mg total) by mouth at bedtime.  Dispense: 180 tablet; Refill: 1   Labs pending Health Maintenance reviewed Diet and exercise encouraged  Follow up plan: Keep chronic follow up    Jannifer Rodney, FNP

## 2022-03-01 NOTE — Patient Instructions (Signed)

## 2022-03-03 ENCOUNTER — Telehealth: Payer: Self-pay | Admitting: Family

## 2022-03-03 NOTE — Telephone Encounter (Signed)
Patient did not understand when she spoke with nurse about results. Would like to speak to someone. Please call back

## 2022-03-03 NOTE — Telephone Encounter (Signed)
Lmtcb.

## 2022-03-09 NOTE — Telephone Encounter (Signed)
No return call 

## 2022-03-09 NOTE — Telephone Encounter (Signed)
Called 2 lmtcb

## 2023-04-26 IMAGING — DX DG LUMBAR SPINE 2-3V
2 series · 2 of 2 positions shown · non-contrast
Comparison: None Available.

CLINICAL DATA: Low back pain.

EXAM:
LUMBAR SPINE - 2-3 VIEW

[l-spine ap]
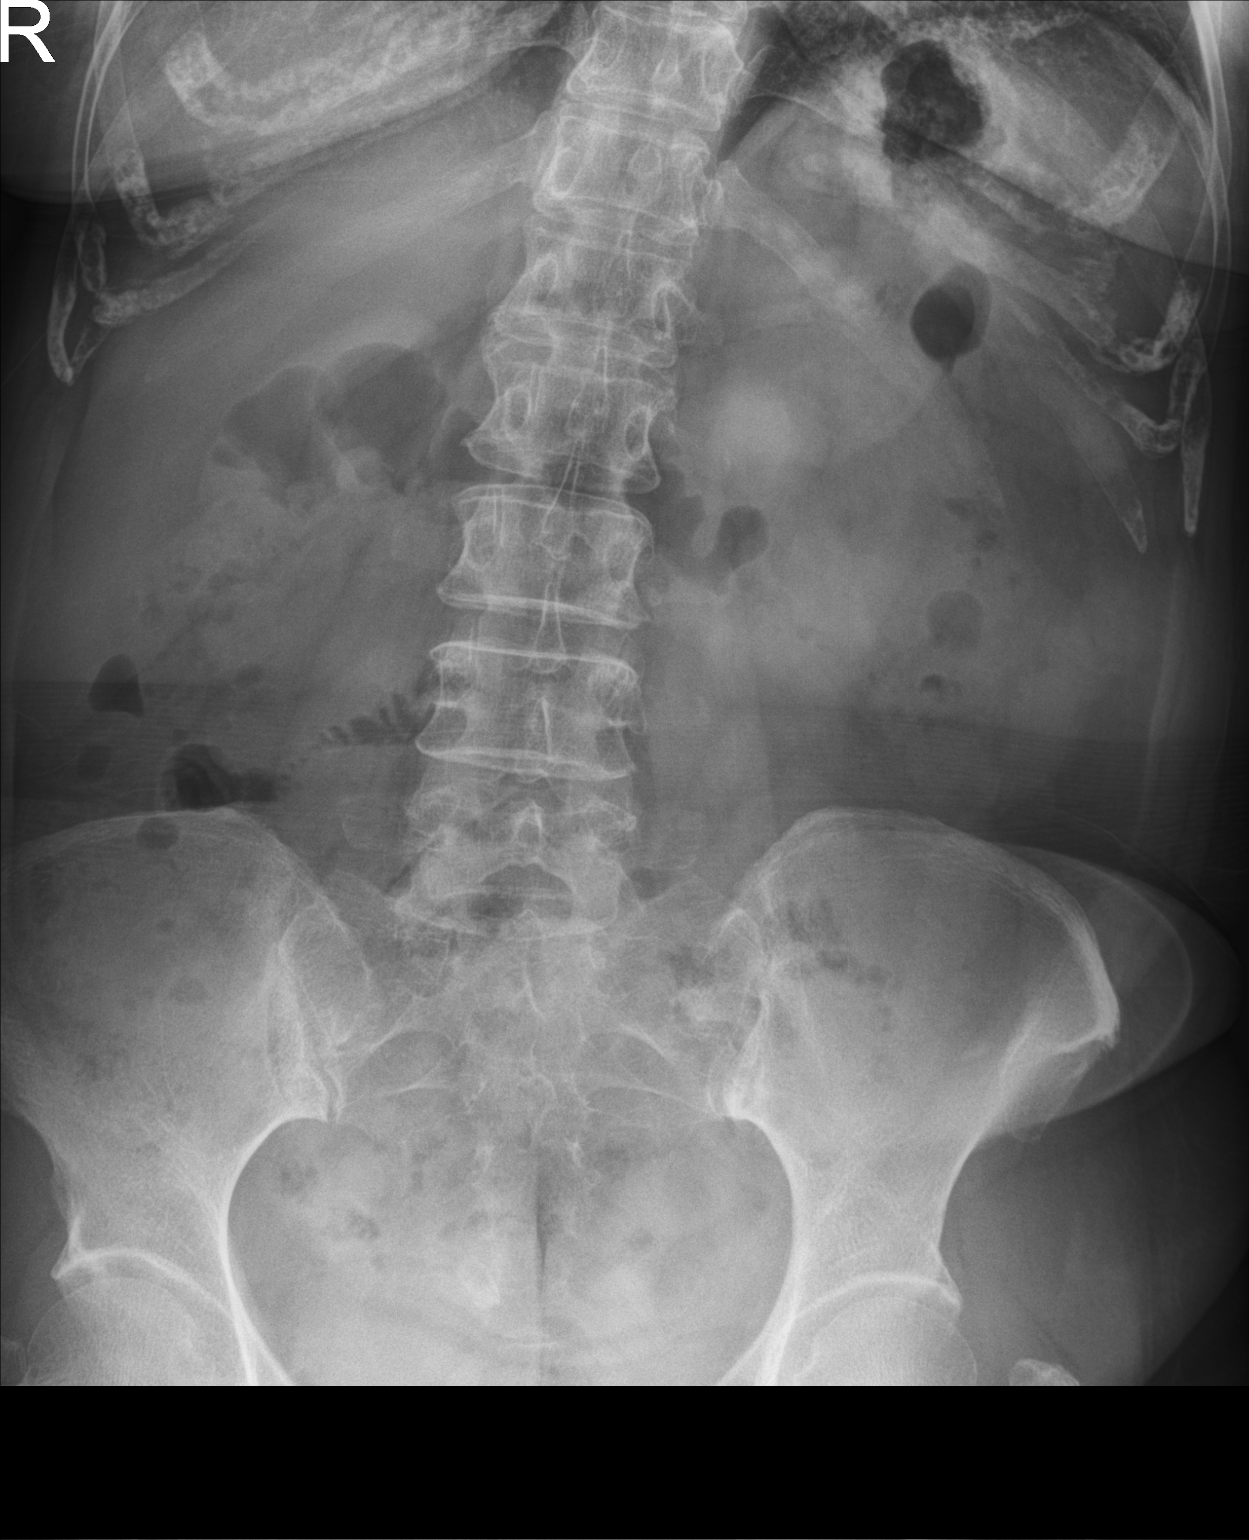

[l-spine lat]
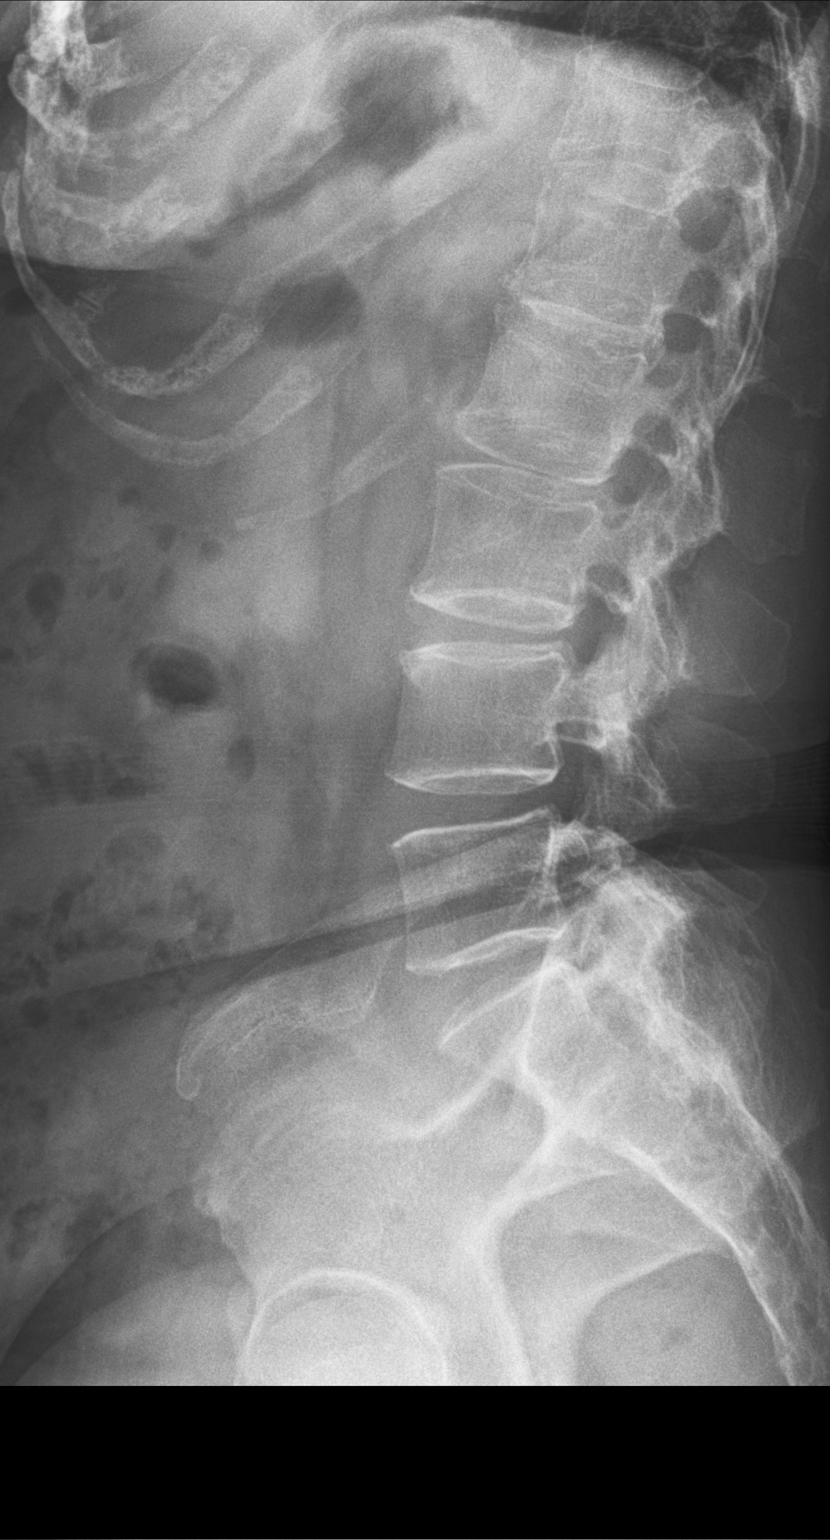

[2 of 2 positions shown; findings below may reference images not displayed]

FINDINGS: There is no evidence of lumbar spine fracture. Alignment is normal.
There is mild multilevel endplate sclerosis. This is most prominent
at the level of L3-L4. Intervertebral disc spaces are maintained.
IMPRESSION: Multilevel degenerative changes, most prominent at the level of
L3-L4.

## 2023-12-04 ENCOUNTER — Ambulatory Visit: Payer: Self-pay

## 2023-12-04 NOTE — Telephone Encounter (Signed)
 I spoke with pt and she can't come in till 9/2 due to her work schedule and it is more of pain in her feet when she first stands to walk and not so much swelling.

## 2023-12-04 NOTE — Telephone Encounter (Signed)
 LMOVM to call back and make appt to be seen.

## 2023-12-04 NOTE — Telephone Encounter (Signed)
 Copied from CRM #[id number]. Topic: Appointments - Appointment Scheduling >> Dec 04, 2023  3:22 PM Delon DASEN wrote: Returning call about making appt, already scheduled for Sept 2.

## 2023-12-04 NOTE — Telephone Encounter (Signed)
 FYI Only or Action Required?: FYI only for provider.  Patient was last seen in primary care on 03/01/2022 by Lavell Bari LABOR, FNP.  Called Nurse Triage reporting Foot Swelling.  Symptoms began a week ago.  Interventions attempted: Rest, hydration, or home remedies.  Symptoms are: unchanged.  Triage Disposition: See PCP Within 2 Weeks  Patient/caregiver understands and will follow disposition?: Yes   Copied from CRM [id number]. Topic: Clinical - Red Word Triage >> Dec 04, 2023  9:56 AM Zane F wrote: [facility] that prompted transfer to Nurse Triage:   Left leg and foot; swells from time to time; tender; sharp pains that go all the way down to her foot and wrap around her heel  Limited mobility  Symptoms started a few days ago Reason for Disposition  [1] MILD pain (e.g., does not interfere with normal activities) AND [2] present > 7 days  Answer Assessment - Initial Assessment Questions 1. ONSET: When did the pain start?      Ongoing and worsening x 1 week 2. LOCATION: Where is the pain located?      Left foot/ heel  3. PAIN: How bad is the pain?    (Scale 1-10; or mild, moderate, severe)     10/10 while resting - when wake up or sit down and get up - then after a few seconds of walking pain goes away 4. WORK OR EXERCISE: Has there been any recent work or exercise that involved this part of the body?      no 5. CAUSE: What do you think is causing the foot pain?     unknown 6. OTHER SYMPTOMS: Do you have any other symptoms? (e.g., leg pain, rash, fever, numbness)     na 7. PREGNANCY: Is there any chance you are pregnant? When was your last menstrual period?     no  Protocols used: Foot Pain-A-AH

## 2023-12-19 ENCOUNTER — Encounter: Payer: Self-pay | Admitting: Nurse Practitioner

## 2023-12-19 ENCOUNTER — Ambulatory Visit: Admitting: Nurse Practitioner

## 2023-12-19 VITALS — BP 119/67 | HR 73 | Temp 97.6°F | Ht 61.0 in | Wt 146.4 lb

## 2023-12-19 DIAGNOSIS — M722 Plantar fascial fibromatosis: Secondary | ICD-10-CM

## 2023-12-19 NOTE — Progress Notes (Signed)
 Subjective:  Patient ID: Kristen Patel, female    DOB: 1961-01-15, 63 y.o.   MRN: [id number]  Patient Care Team: Lavell Bari LABOR, FNP as PCP - General (Nurse Practitioner)   Chief Complaint:  Foot Pain (Left foot pain for a year mainly in morning when she wakes up)   HPI: Kristen Patel is a 63 y.o. female presenting on 12/19/2023 for Foot Pain (Left foot pain for a year mainly in morning when she wakes up)   Discussed the use of AI scribe software for clinical note transcription with the patient, who gave verbal consent to proceed.  History of Present Illness Kristen Patel is a 63 year old female who presents with left foot arch pain and back pain.  She experiences aching soreness in the arch of her left foot, primarily noticeable when sitting down or getting out of bed. The pain is rated as 8 out of 10 and subsides after taking a few steps. She has not taken any pain relievers, preferring to avoid them, and uses shoe inserts but has not consulted a podiatrist.  She has a history of sciatica, which she believes has recurred, causing back pain that radiates to her groin and the top of her foot. The pain is sometimes accompanied by leg cramps, particularly upon waking. Exercises learned from online resources provide some relief, and she occasionally uses a heating pad on her back and leg. She has taken Tylenol three times but prefers not to continue it.  Her back pain was previously evaluated at an urgent care in May 2023, where she underwent an MRI. She was advised to see a spine specialist but did not follow through due to work commitments and having undergone physical therapy. She was out of work for three months due to her back issues.  She works as a Museum/gallery exhibitions officer, which involves periods of sitting and physical activity.      Relevant past medical, surgical, family, and social history reviewed and updated as indicated.  Allergies and medications reviewed and updated. Data  reviewed: Chart in Epic.   History reviewed. No pertinent past medical history.  Past Surgical History:  Procedure Laterality Date   CESAREAN SECTION      Social History   Socioeconomic History   Marital status: Married    Spouse name: Not on file   Number of children: Not on file   Years of education: Not on file   Highest education level: Not on file  Occupational History   Not on file  Tobacco Use   Smoking status: Never   Smokeless tobacco: Never  Vaping Use   Vaping status: Never Used  Substance and Sexual Activity   Alcohol use: No   Drug use: No   Sexual activity: Not on file  Other Topics Concern   Not on file  Social History Narrative   Not on file   Social Drivers of Health   Financial Resource Strain: Patient Declined (11/05/2023)   Received from [facility]   Overall Financial Resource Strain (CARDIA)    How hard is it for you to pay for the very basics like food, housing, medical care, and heating?: Patient declined  Food Insecurity: No Food Insecurity (11/06/2023)   Received from [facility]   Hunger Vital Sign    Within the past 12 months, you worried that your food would run out before you got the money to buy more.: Never true    Within  the past 12 months, the food you bought just didn't last and you didn't have money to get more.: Never true  Transportation Needs: No Transportation Needs (11/06/2023)   Received from [facility] - Transportation    Lack of Transportation (Medical): No    Lack of Transportation (Non-Medical): No  Physical Activity: Insufficiently Active (11/06/2023)   Received from [facility]   Exercise Vital Sign    On average, how many days per week do you engage in moderate to strenuous exercise (like a brisk walk)?: 3 days    On average, how many minutes do you engage in exercise at this level?: 10 min  Stress: No Stress Concern Present (11/06/2023)   Received from [facility] - Occupational Stress Questionnaire    Do you feel stress - tense, restless, nervous, or anxious, or unable to sleep at night because your mind is troubled all the time - these days?: Only a little  Social Connections: Not on file  Intimate Partner Violence: Not At Risk (11/06/2023)   Received from [facility]   Humiliation, Afraid, Rape, and Kick questionnaire    Within the last year, have you been afraid of your partner or ex-partner?: No    Within the last year, have you been humiliated or emotionally abused in other ways by your partner or ex-partner?: No    Within the last year, have you been kicked, hit, slapped, or otherwise physically hurt by your partner or ex-partner?: No    Within the last year, have you been raped or forced to have any kind of sexual activity by your partner or ex-partner?: No    Outpatient Encounter Medications as of 12/19/2023  Medication Sig   albuterol  (VENTOLIN  HFA) 108 (90 Base) MCG/ACT inhaler Inhale 2 puffs into the lungs every 6 (six) hours as needed for wheezing or shortness of breath.   baclofen  (LIORESAL ) 10 MG tablet TAKE 1 TABLET BY MOUTH 3 TIMES DAILY AS NEEDED FOR MUSCLE SPASMS (PAIN).   benzonatate  (TESSALON ) 200 MG capsule Take 1 capsule (200 mg total) by mouth 3 (three) times daily as needed.   calcium citrate (CALCITRATE - DOSED IN MG ELEMENTAL CALCIUM) 950 MG tablet Take 1 tablet by mouth daily.   Cholecalciferol (VITAMIN D3) 3000 UNITS TABS Take by mouth.   Cyanocobalamin (VITAMIN B12 PO) Take by mouth.   fish oil-omega-3 fatty acids 1000 MG capsule Take 2 g by mouth daily.   Flaxseed, Linseed, (FLAX SEEDS PO) Take by mouth.   MAGNESIUM PO Take by mouth.   Multiple Vitamins-Minerals (WOMENS MULTIVITAMIN PO) Take by mouth.   Multiple Vitamins-Minerals (ZINC PO) Take by mouth.   naproxen  (NAPROSYN ) 500 MG tablet Take 1 tablet (500 mg total) by mouth 2 (two) times daily with a meal.   omeprazole  (PRILOSEC) 20 MG capsule  TAKE 1 CAPSULE BY MOUTH EVERY DAY   predniSONE  (STERAPRED UNI-PAK 21 TAB) 10 MG (21) TBPK tablet Use as directed   traZODone  (DESYREL ) 50 MG tablet Take 1-2 tablets (50-100 mg total) by mouth at bedtime.   TURMERIC CURCUMIN PO Take by mouth.   vitamin E 180 MG (400 UNITS) capsule Take 400 Units by mouth daily.   No facility-administered encounter medications on file as of 12/19/2023.    No Known Allergies  Pertinent ROS per HPI, otherwise unremarkable      Objective:  BP 119/67   Pulse 73   Temp 97.6 F (  36.4 C) (Temporal)   Ht 5' 1 (1.549 m)   Wt 146 lb 6.4 oz (66.4 kg)   SpO2 95%   BMI 27.66 kg/m    Wt Readings from Last 3 Encounters:  12/19/23 146 lb 6.4 oz (66.4 kg)  03/01/22 137 lb 9.6 oz (62.4 kg)  11/18/21 143 lb (64.9 kg)    Physical Exam Vitals and nursing note reviewed.  Constitutional:      General: She is not in acute distress. HENT:     Head: Normocephalic and atraumatic.     Nose: Nose normal.     Mouth/Throat:     Mouth: Mucous membranes are moist.  Eyes:     General: No scleral icterus.    Extraocular Movements: Extraocular movements intact.     Conjunctiva/sclera: Conjunctivae normal.     Pupils: Pupils are equal, round, and reactive to light.  Cardiovascular:     Heart sounds: Normal heart sounds.  Pulmonary:     Effort: Pulmonary effort is normal.  Musculoskeletal:     Right foot: Normal.     Left foot: Tenderness present.     Comments: Foot arch  Skin:    General: Skin is warm and dry.     Findings: No rash.  Neurological:     Mental Status: She is alert and oriented to person, place, and time.  Psychiatric:        Mood and Affect: Mood normal.        Behavior: Behavior normal.        Thought Content: Thought content normal.        Judgment: Judgment normal.    Physical Exam MUSCULOSKELETAL: Pain in the left foot arch on palpation.     Results for orders placed or performed in visit on 05/25/21  CMP14+EGFR   Collection Time:  05/25/21 12:16 PM  Result Value Ref Range   Glucose 98 70 - 99 mg/dL   BUN 12 8 - 27 mg/dL   Creatinine, Ser 9.40 0.57 - 1.00 mg/dL   eGFR 896 >40 fO/fpw/8.26   BUN/Creatinine Ratio 20 12 - 28   Sodium 137 134 - 144 mmol/L   Potassium 4.1 3.5 - 5.2 mmol/L   Chloride 102 96 - 106 mmol/L   CO2 22 20 - 29 mmol/L   Calcium 9.0 8.7 - 10.3 mg/dL   Total Protein 7.3 6.0 - 8.5 g/dL   Albumin 4.3 3.8 - 4.9 g/dL   Globulin, Total 3.0 1.5 - 4.5 g/dL   Albumin/Globulin Ratio 1.4 1.2 - 2.2   Bilirubin Total 0.3 0.0 - 1.2 mg/dL   Alkaline Phosphatase 80 44 - 121 IU/L   AST 21 0 - 40 IU/L   ALT 28 0 - 32 IU/L  CBC with Differential/Platelet   Collection Time: 05/25/21 12:16 PM  Result Value Ref Range   WBC 6.2 3.4 - 10.8 x10E3/uL   RBC 5.00 3.77 - 5.28 x10E6/uL   Hemoglobin 14.9 11.1 - 15.9 g/dL   Hematocrit 54.9 65.9 - 46.6 %   MCV 90 79 - 97 fL   MCH 29.8 26.6 - 33.0 pg   MCHC 33.1 31.5 - 35.7 g/dL   RDW 88.0 88.2 - 84.5 %   Platelets 301 150 - 450 x10E3/uL   Neutrophils 46 Not Estab. %   Lymphs 36 Not Estab. %   Monocytes 11 Not Estab. %   Eos 6 Not Estab. %   Basos 1 Not Estab. %   Neutrophils Absolute 2.9 1.4 - 7.0  x10E3/uL   Lymphocytes Absolute 2.2 0.7 - 3.1 x10E3/uL   Monocytes Absolute 0.7 0.1 - 0.9 x10E3/uL   EOS (ABSOLUTE) 0.3 0.0 - 0.4 x10E3/uL   Basophils Absolute 0.1 0.0 - 0.2 x10E3/uL   Immature Granulocytes 0 Not Estab. %   Immature Grans (Abs) 0.0 0.0 - 0.1 x10E3/uL  Lipid panel   Collection Time: 05/25/21 12:16 PM  Result Value Ref Range   Cholesterol, Total 228 (H) 100 - 199 mg/dL   Triglycerides 805 (H) 0 - 149 mg/dL   HDL 66 >60 mg/dL   VLDL Cholesterol Cal 34 5 - 40 mg/dL   LDL Chol Calc (NIH) 871 (H) 0 - 99 mg/dL   Chol/HDL Ratio 3.5 0.0 - 4.4 ratio  TSH   Collection Time: 05/25/21 12:16 PM  Result Value Ref Range   TSH 4.610 (H) 0.450 - 4.500 uIU/mL  HIV Antibody (routine testing w rflx)   Collection Time: 05/25/21 12:16 PM  Result Value Ref Range    HIV Screen 4th Generation wRfx Non Reactive Non Reactive  Hepatitis C antibody   Collection Time: 05/25/21 12:16 PM  Result Value Ref Range   Hep C Virus Ab <0.1 0.0 - 0.9 s/co ratio       Pertinent labs & imaging results that were available during my care of the patient were reviewed by me and considered in my medical decision making.  Assessment & Plan:  Bennie was seen today for foot pain.  Diagnoses and all orders for this visit:  Plantar fasciitis of left foot -     Ambulatory referral to Podiatry     Assessment and Plan Assessment & Plan Left foot plantar fasciitis Chronic left foot arch pain, likely plantar fasciitis. No prior analgesic treatment or podiatric consultation. - Refer to podiatrist for evaluation and management. - Advise use of frozen water bottle for pain relief. - follow up with PCP fro chronic back pain with sciatica and possible referral to spine specialist as suggested post MRI two yrs ago     Continue all other maintenance medications.  Follow up plan: Return in about 6 weeks (around 01/30/2024) for discuss ongoing back pain since 2023.   Continue healthy lifestyle choices, including diet (rich in fruits, vegetables, and lean proteins, and low in salt and simple carbohydrates) and exercise (at least 30 minutes of moderate physical activity daily).  Educational handout given for plantar fascitis  The above assessment and management plan was discussed with the patient. The patient verbalized understanding of and has agreed to the management plan. Patient is aware to call the clinic if they develop any new symptoms or if symptoms persist or worsen. Patient is aware when to return to the clinic for a follow-up visit. Patient educated on when it is appropriate to go to the emergency department.  Anice Wilshire [place] Thompson, DNP [facility] [address] [place], [place] [address] [phone]

## 2024-01-23 ENCOUNTER — Ambulatory Visit: Admitting: Family

## 2024-01-23 ENCOUNTER — Encounter: Payer: Self-pay | Admitting: Family

## 2024-01-23 ENCOUNTER — Other Ambulatory Visit: Payer: Self-pay | Admitting: Family

## 2024-01-23 ENCOUNTER — Ambulatory Visit (INDEPENDENT_AMBULATORY_CARE_PROVIDER_SITE_OTHER)

## 2024-01-23 VITALS — BP 136/66 | HR 68 | Temp 97.6°F | Ht 61.0 in | Wt 146.0 lb

## 2024-01-23 DIAGNOSIS — R946 Abnormal results of thyroid function studies: Secondary | ICD-10-CM | POA: Diagnosis not present

## 2024-01-23 DIAGNOSIS — Z0000 Encounter for general adult medical examination without abnormal findings: Secondary | ICD-10-CM

## 2024-01-23 DIAGNOSIS — Z780 Asymptomatic menopausal state: Secondary | ICD-10-CM | POA: Diagnosis not present

## 2024-01-23 DIAGNOSIS — M79671 Pain in right foot: Secondary | ICD-10-CM | POA: Diagnosis not present

## 2024-01-23 DIAGNOSIS — Z1211 Encounter for screening for malignant neoplasm of colon: Secondary | ICD-10-CM

## 2024-01-23 DIAGNOSIS — M722 Plantar fascial fibromatosis: Secondary | ICD-10-CM

## 2024-01-23 DIAGNOSIS — Z0001 Encounter for general adult medical examination with abnormal findings: Secondary | ICD-10-CM

## 2024-01-23 MED ORDER — MELOXICAM 7.5 MG PO TABS: 7.5000 mg | ORAL_TABLET | Freq: Every day | ORAL | 1 refills | Status: AC

## 2024-01-23 NOTE — Progress Notes (Signed)
 Subjective:    Patient ID: Kristen Patel, female    DOB: 11-Feb-1961, 63 y.o.   MRN: [id number]  Chief Complaint  Patient presents with   Foot Pain   Pt presents to the office today with CPE and right foot pain that comes and goes. Reports aching pain of 4 out 10. Report taking Mobic and doing stretches that help.   She is followed by [facility] for left foot pain and was given a steroid injection. That has greatly improved.   Foot Pain This is a recurrent problem. The current episode started more than 1 month ago. The problem occurs intermittently. The problem has been waxing and waning.      Review of Systems  All other systems reviewed and are negative.   Social History   Socioeconomic History   Marital status: Married    Spouse name: Not on file   Number of children: Not on file   Years of education: Not on file   Highest education level: Not on file  Occupational History   Not on file  Tobacco Use   Smoking status: Never   Smokeless tobacco: Never  Vaping Use   Vaping status: Never Used  Substance and Sexual Activity   Alcohol use: No   Drug use: No   Sexual activity: Not on file  Other Topics Concern   Not on file  Social History Narrative   Not on file   Social Drivers of Health   Financial Resource Strain: Patient Declined (11/05/2023)   Received from [facility]   Overall Financial Resource Strain (CARDIA)    How hard is it for you to pay for the very basics like food, housing, medical care, and heating?: Patient declined  Food Insecurity: No Food Insecurity (11/06/2023)   Received from [facility]   Hunger Vital Sign    Within the past 12 months, you worried that your food would run out before you got the money to buy more.: Never true    Within the past 12 months, the food you bought just didn't last and you didn't have money to get more.: Never true  Transportation Needs: No Transportation Needs (11/06/2023)   Received from [facility]    PRAPARE - Transportation    Lack of Transportation (Medical): No    Lack of Transportation (Non-Medical): No  Physical Activity: Insufficiently Active (11/06/2023)   Received from [facility]   Exercise Vital Sign    On average, how many days per week do you engage in moderate to strenuous exercise (like a brisk walk)?: 3 days    On average, how many minutes do you engage in exercise at this level?: 10 min  Stress: No Stress Concern Present (11/06/2023)   Received from [facility] - Occupational Stress Questionnaire    Do you feel stress - tense, restless, nervous, or anxious, or unable to sleep at night because your mind is troubled all the time - these days?: Only a little  Social Connections: Not on file   Family History  Problem Relation Age of Onset   Healthy Mother    Healthy Father         Objective:   Physical Exam Vitals reviewed.  Constitutional:      General: She is not in acute distress.    Appearance: She is well-developed.  HENT:     Head: Normocephalic and atraumatic.     Right Ear:  Tympanic membrane normal.     Left Ear: Tympanic membrane normal.  Eyes:     Pupils: Pupils are equal, round, and reactive to light.  Neck:     Thyroid: No thyromegaly.  Cardiovascular:     Rate and Rhythm: Normal rate and regular rhythm.     Heart sounds: Normal heart sounds. No murmur heard. Pulmonary:     Effort: Pulmonary effort is normal. No respiratory distress.     Breath sounds: Normal breath sounds. No wheezing.  Abdominal:     General: Bowel sounds are normal. There is no distension.     Palpations: Abdomen is soft.     Tenderness: There is no abdominal tenderness.  Musculoskeletal:        General: No tenderness. Normal range of motion.     Cervical back: Normal range of motion and neck supple.  Skin:    General: Skin is warm and dry.  Neurological:     Mental Status: She is alert and oriented to person, place,  and time.     Cranial Nerves: No cranial nerve deficit.     Deep Tendon Reflexes: Reflexes are normal and symmetric.  Psychiatric:        Behavior: Behavior normal.        Thought Content: Thought content normal.        Judgment: Judgment normal.       BP 136/66   Pulse 68   Temp 97.6 F (36.4 C) (Temporal)   Ht 5' 1 (1.549 m)   Wt 146 lb (66.2 kg)   BMI 27.59 kg/m      Assessment & Plan:  ARAH ARO comes in today with chief complaint of Foot Pain   Diagnosis and orders addressed:  1. Annual physical exam (Primary) - CMP14+EGFR - CBC with Differential/Platelet  2. Colon cancer screening - Ambulatory referral to Gastroenterology - CMP14+EGFR  3. Right foot pain Good foot support  Mobic as needed, no other NSAID's  - CMP14+EGFR - meloxicam (MOBIC) 7.5 MG tablet; Take 1 tablet (7.5 mg total) by mouth daily.  Dispense: 90 tablet; Refill: 1  4. Plantar fasciitis - CMP14+EGFR - meloxicam (MOBIC) 7.5 MG tablet; Take 1 tablet (7.5 mg total) by mouth daily.  Dispense: 90 tablet; Refill: 1  5. Abnormal thyroid exam - TSH  6. Post-menopause - DG [facility] DEXA   Labs pending Restart Mobic Continue current medications  Keep follow up with Podiatry  Health Maintenance reviewed Diet and exercise encouraged  Return in about 1 year (around 01/22/2025), or if symptoms worsen or fail to improve.    Bari Learn, FNP

## 2024-01-23 NOTE — Patient Instructions (Signed)

## 2024-01-24 LAB — CBC WITH DIFFERENTIAL/PLATELET
Basophils Absolute: 0.1 x10E3/uL (ref 0.0–0.2)
Basos: 1 %
EOS (ABSOLUTE): 0.6 x10E3/uL — ABNORMAL HIGH (ref 0.0–0.4)
Eos: 7 %
Hematocrit: 43.5 % (ref 34.0–46.6)
Hemoglobin: 13.9 g/dL (ref 11.1–15.9)
Immature Grans (Abs): 0 x10E3/uL (ref 0.0–0.1)
Immature Granulocytes: 0 %
Lymphocytes Absolute: 2.6 x10E3/uL (ref 0.7–3.1)
Lymphs: 34 %
MCH: 30.3 pg (ref 26.6–33.0)
MCHC: 32 g/dL (ref 31.5–35.7)
MCV: 95 fL (ref 79–97)
Monocytes Absolute: 0.6 x10E3/uL (ref 0.1–0.9)
Monocytes: 8 %
Neutrophils Absolute: 3.8 x10E3/uL (ref 1.4–7.0)
Neutrophils: 50 %
Platelets: 310 x10E3/uL (ref 150–450)
RBC: 4.58 x10E6/uL (ref 3.77–5.28)
RDW: 12.6 % (ref 11.7–15.4)
WBC: 7.6 x10E3/uL (ref 3.4–10.8)

## 2024-01-24 LAB — CMP14+EGFR
ALT: 24 IU/L (ref 0–32)
AST: 19 IU/L (ref 0–40)
Albumin: 4.4 g/dL (ref 3.9–4.9)
Alkaline Phosphatase: 84 IU/L (ref 49–135)
BUN/Creatinine Ratio: 16 (ref 12–28)
BUN: 16 mg/dL (ref 8–27)
Bilirubin Total: 0.2 mg/dL (ref 0.0–1.2)
CO2: 22 mmol/L (ref 20–29)
Calcium: 8.9 mg/dL (ref 8.7–10.3)
Chloride: 104 mmol/L (ref 96–106)
Creatinine, Ser: 0.99 mg/dL (ref 0.57–1.00)
Globulin, Total: 2.5 g/dL (ref 1.5–4.5)
Glucose: 129 mg/dL — AB (ref 70–99)
Potassium: 3.6 mmol/L (ref 3.5–5.2)
Sodium: 140 mmol/L (ref 134–144)
Total Protein: 6.9 g/dL (ref 6.0–8.5)
eGFR: 64 mL/min/1.73 (ref >59)

## 2024-01-24 LAB — TSH: TSH: 4.81 u[IU]/mL — AB (ref 0.450–4.500)

## 2024-01-25 ENCOUNTER — Other Ambulatory Visit: Payer: Self-pay | Admitting: Family

## 2024-01-25 ENCOUNTER — Ambulatory Visit: Payer: Self-pay | Admitting: Family

## 2024-01-25 DIAGNOSIS — M8580 Other specified disorders of bone density and structure, unspecified site: Secondary | ICD-10-CM

## 2024-01-25 DIAGNOSIS — E039 Hypothyroidism, unspecified: Secondary | ICD-10-CM | POA: Insufficient documentation

## 2024-01-25 MED ORDER — LEVOTHYROXINE SODIUM 25 MCG PO TABS: 25.0000 ug | ORAL_TABLET | Freq: Every day | ORAL | 1 refills | Status: AC

## 2024-01-26 DIAGNOSIS — M8580 Other specified disorders of bone density and structure, unspecified site: Secondary | ICD-10-CM | POA: Insufficient documentation

## 2024-03-13 ENCOUNTER — Encounter (INDEPENDENT_AMBULATORY_CARE_PROVIDER_SITE_OTHER): Payer: Self-pay | Admitting: *Deleted

## 2025-01-23 ENCOUNTER — Encounter: Payer: Self-pay | Admitting: Family
